# Patient Record
Sex: Male | Born: 1955 | Race: White | Hispanic: No | Marital: Married | Smoking: Former smoker
Health system: Southern US, Community
[De-identification: ages and names within clinical notes are randomized; demographics above are authoritative.]

## PROBLEM LIST (undated history)

## (undated) DIAGNOSIS — K760 Fatty (change of) liver, not elsewhere classified: Secondary | ICD-10-CM

## (undated) DIAGNOSIS — M9110 Juvenile osteochondrosis of head of femur [Legg-Calve-Perthes], unspecified leg: Secondary | ICD-10-CM

## (undated) DIAGNOSIS — I10 Essential (primary) hypertension: Secondary | ICD-10-CM

## (undated) DIAGNOSIS — E7800 Pure hypercholesterolemia, unspecified: Secondary | ICD-10-CM

## (undated) DIAGNOSIS — F411 Generalized anxiety disorder: Secondary | ICD-10-CM

## (undated) DIAGNOSIS — E119 Type 2 diabetes mellitus without complications: Secondary | ICD-10-CM

## (undated) DIAGNOSIS — K219 Gastro-esophageal reflux disease without esophagitis: Secondary | ICD-10-CM

## (undated) DIAGNOSIS — Z87898 Personal history of other specified conditions: Secondary | ICD-10-CM

## (undated) DIAGNOSIS — I7300 Raynaud's syndrome without gangrene: Secondary | ICD-10-CM

## (undated) DIAGNOSIS — G629 Polyneuropathy, unspecified: Secondary | ICD-10-CM

## (undated) HISTORY — PX: SPINAL CORD STIMULATOR IMPLANT: SHX2422

## (undated) HISTORY — DX: Fatty (change of) liver, not elsewhere classified: K76.0

## (undated) HISTORY — PX: CARDIAC CATHETERIZATION: SHX172

## (undated) HISTORY — DX: Polyneuropathy, unspecified: G62.9

## (undated) HISTORY — DX: Pure hypercholesterolemia, unspecified: E78.00

## (undated) HISTORY — DX: Personal history of other specified conditions: Z87.898

## (undated) HISTORY — DX: Juvenile osteochondrosis of head of femur (Legg-Calve-Perthes), unspecified leg: M91.10

## (undated) HISTORY — DX: Raynaud's syndrome without gangrene: I73.00

## (undated) HISTORY — DX: Essential (primary) hypertension: I10

## (undated) HISTORY — DX: Type 2 diabetes mellitus without complications: E11.9

## (undated) HISTORY — DX: Generalized anxiety disorder: F41.1

## (undated) HISTORY — PX: COLONOSCOPY: SHX174

## (undated) HISTORY — DX: Gastro-esophageal reflux disease without esophagitis: K21.9

---

## 2002-10-02 ENCOUNTER — Ambulatory Visit (HOSPITAL_COMMUNITY): Admission: RE | Admit: 2002-10-02 | Discharge: 2002-10-02 | Payer: Self-pay | Admitting: Gastroenterology

## 2004-09-15 ENCOUNTER — Ambulatory Visit (HOSPITAL_COMMUNITY): Admission: RE | Admit: 2004-09-15 | Discharge: 2004-09-15 | Payer: Self-pay | Admitting: *Deleted

## 2008-04-01 ENCOUNTER — Ambulatory Visit: Payer: Self-pay | Admitting: Family Medicine

## 2008-04-01 DIAGNOSIS — M7660 Achilles tendinitis, unspecified leg: Secondary | ICD-10-CM

## 2010-06-05 NOTE — Op Note (Signed)
   NAME:  Todd Gallegos, Todd Gallegos                            ACCOUNT NO.:  [id number]   MEDICAL RECORD NO.:  [id number]                   PATIENT TYPE:  AMB   LOCATION:  ENDO                                 FACILITY:  [facility]   PHYSICIAN:  Graylin Shiver, M.D.                DATE OF BIRTH:  27-Dec-1955   DATE OF PROCEDURE:  10/02/2002  DATE OF DISCHARGE:                                 OPERATIVE REPORT   PROCEDURE:  Colonoscopy.   INDICATIONS FOR PROCEDURE:  Family history of colon cancer in the patient's  father. Recent episode of rectal bleeding.  Informed consent was obtained after explanation of the risks of bleeding,  infection and perforation.   PREMEDICATIONS:  Fentanyl 80 micrograms IV, Versed 10 mg IV.   DESCRIPTION OF PROCEDURE:  With the patient in the left lateral decubitus  position a rectal examination  was performed and no masses were felt. The  Olympus colonoscope was inserted into the rectum and advanced  around the  colon to the cecum. Cecal landmarks were identified.   The cecum and ascending colon were normal. The transverse colon was normal.  The descending colon, sigmoid and rectum were normal. The scope was  retroflexed  in the rectum. No abnormalities were seen. The scope was  straightened and brought  out.  He tolerated the procedure well without complications.   IMPRESSION:  Normal colonoscopy to the cecum.                                               Graylin Shiver, M.D.    [name]  D:  10/02/2002  T:  10/02/2002  Job:  [id number]   cc:   Manuela Schwartz, M.D.

## 2010-06-05 NOTE — Cardiovascular Report (Signed)
NAMEURIAN, MARTENSON NO.:  [id number]   MEDICAL RECORD NO.:  [id number]          PATIENT TYPE:  OIB   LOCATION:  2899                         FACILITY:  [facility]   PHYSICIAN:  Meade Maw, M.D.    DATE OF BIRTH:  1956/01/03   DATE OF PROCEDURE:  DATE OF DISCHARGE:                              CARDIAC CATHETERIZATION   REFERRING PHYSICIAN:  Francis P. Modesto Charon, M.D.   INDICATIONS FOR PROCEDURE:  Abnormal Cardiolite, equivocal anterolateral  ischemia and the patient has felt that his exercise tolerance has decreased  with increasing dyspnea.   PROCEDURE:  After obtaining written informed consent, the patient was  brought to the cardiac catheterization lab in a post absorptive state.  Pre-  op sedation was achieved using Versed 2 mg IV.  Local anesthesia was  achieved using 1% Xylocaine.  A 6-French hemostasis sheath was placed into  the right femoral artery using the modified Seldinger technique.  Selective  coronary angiography was performed using a JL-4 and a to JR non-torque  right.  A single plane ventriculogram was performed in the REO position  using a 6-French pigtail curved catheter.  All catheter exchanges were made  over a guidewire.  The hemostasis sheath was flushed following each catheter  exchange.  There was no coronary artery disease identified.  The patient was  transferred to the holding area.  The hemostasis sheath was removed.  Hemostasis was achieved using digital pressure.  There was no immediate  complications.   FINDINGS:  1.  The aortic pressure is 110/67.  2.  His LV pressure was 97/5.  3.  The EDP is 10-12.   SINGLE-PLANE VENTRICULOGRAM:  Reveals normal wall motion, ejection fraction  of 60-65%.  There is post PVC MR noted only.   FLUOROSCOPY:  Reveals no significant calcification.   CORONARY ANGIOGRAPHY:  1.  The left main coronary artery bifurcates into the left anterior      descending and circumflex vessel.  There is no disease  noted in the left      main coronary artery.  2.  The left anterior descending.  The left anterior descending is a large      caliber vessel.  It gives rise to multiple diagonals.  It goes on to end      as an apical branch.  There is no disease noted in the left anterior      descending or its branches.  3.  Circumflex vessel.  The circumflex vessel is a moderate size caliber      vessel.  It gives rise to trivial OM-1, two large posterolateral      branches.  There is no disease noted in the circumflex or its branches.  4.  Right coronary artery.  The right coronary artery is a moderate sized      vessel.  It gives rise to two RV marginals, a small PDA, a small PL      branch.  There is no disease noted in the right coronary artery or its      branches.   IMPRESSION:  1.  Normal coronary angiography.  2.  Normal single-plane ventriculogram.  3.  Normal end-diastolic pressure.  4.  Other etiologies should be considered for dyspnea.      Meade Maw, M.D.  Electronically Signed     HP/MEDQ  D:  09/15/2004  T:  09/15/2004  Job:  [id number]   cc:   Thelma Barge P. Modesto Charon, M.D.  [address]  [place]  [place] [address]  Fax: [phone]

## 2018-04-21 ENCOUNTER — Ambulatory Visit: Payer: Self-pay | Admitting: Cardiology

## 2018-05-05 ENCOUNTER — Ambulatory Visit: Payer: Self-pay | Admitting: Cardiology

## 2018-05-18 ENCOUNTER — Other Ambulatory Visit: Payer: Self-pay | Admitting: Cardiology

## 2018-05-18 DIAGNOSIS — I340 Nonrheumatic mitral (valve) insufficiency: Secondary | ICD-10-CM

## 2018-12-25 ENCOUNTER — Other Ambulatory Visit: Payer: Self-pay

## 2019-02-22 ENCOUNTER — Encounter: Payer: Self-pay | Admitting: *Deleted

## 2019-02-23 ENCOUNTER — Encounter: Payer: Self-pay | Admitting: Neurology

## 2019-02-23 ENCOUNTER — Other Ambulatory Visit: Payer: Self-pay

## 2019-02-23 ENCOUNTER — Ambulatory Visit (INDEPENDENT_AMBULATORY_CARE_PROVIDER_SITE_OTHER): Admitting: Neurology

## 2019-02-23 VITALS — BP 121/65 | HR 72 | Temp 97.3°F | Ht 69.0 in | Wt 196.3 lb

## 2019-02-23 DIAGNOSIS — R202 Paresthesia of skin: Secondary | ICD-10-CM | POA: Diagnosis not present

## 2019-02-23 DIAGNOSIS — R269 Unspecified abnormalities of gait and mobility: Secondary | ICD-10-CM | POA: Diagnosis not present

## 2019-02-23 DIAGNOSIS — M7989 Other specified soft tissue disorders: Secondary | ICD-10-CM | POA: Diagnosis not present

## 2019-02-23 MED ORDER — GABAPENTIN 100 MG PO CAPS: 300.0000 mg | ORAL_CAPSULE | Freq: Every day | ORAL | 11 refills | Status: DC

## 2019-02-23 NOTE — Addendum Note (Signed)
Addended by: Levert Feinstein on: 02/23/2019 10:07 AM   Modules accepted: Orders

## 2019-02-23 NOTE — Progress Notes (Signed)
PATIENT: Todd Gallegos DOB: 07-Apr-1955  Chief Complaint  Patient presents with  . New Patient (Initial Visit)    New room, alone. Paper referral for peripheral neuropathy. Has worsenend in the last three years. Numbness/ting from knees down. Cannot sit in recliner long watching TV.  Marland Kitchen PCP    Beverley Fiedler, MD     HISTORICAL  Todd Gallegos is a 64 year old male, seen in request by his primary care physician Dr. Barbaraann Barthel, [place], for evaluation of bilateral feet paresthesia, initial evaluation was on February 23, 2019.  I have reviewed and summarized the referring note from the referring physician.  He had a past medical history of depression, currently on stable dose of Lexapro 20 mg daily, Wellbutrin XL 300 mg daily, also had a history of hypertension, prediabetes, most recent A1c was 6.2, he reported 35 years history of heavy alcohol use, created in 2015  Around 2015, he noticed mild bilateral feet and toes paresthesia, mainly involving plantar surface, numbness tingling, gradually getting worse over the past 5 years, now at evening time, when he tried to relax, sitting down watching TV, he would have burning pain of his bilateral feet, difficult to sit still, sometimes toes purplish reddish discoloration,  He denies significant gait abnormality, able to run 2 to 3 miles without difficulty, but with running, he noticed burning sensation extending to lateral shin level, over the past 10 years, he also noticed bilateral shin lateral mass, soft, nontender to touch, stable in size, but was running, it becomes sensitive, burning pain.  He denies bilateral upper extremity involvement, he denies neck pain,  Laboratory evaluations in December 2020 showed elevated A1c 6.2, normal B12, folic acid, TSH, CMP showed mild abnormal liver functional test, normal CBC hemoglobin of 14   REVIEW OF SYSTEMS: Full 14 system review of systems performed and notable only for as above All other review of  systems were negative.  ALLERGIES: No Known Allergies  HOME MEDICATIONS: Current Outpatient Medications  Medication Sig Dispense Refill  . atorvastatin (LIPITOR) 20 MG tablet Take 20 mg by mouth daily.    . benazepril-hydrochlorthiazide (LOTENSIN HCT) 20-25 MG tablet Take 1 tablet by mouth daily.    Marland Kitchen buPROPion (WELLBUTRIN XL) 300 MG 24 hr tablet Take 300 mg by mouth daily.    . Coenzyme Q10 (CO Q 10 PO) Take 60 mg by mouth daily.    Marland Kitchen escitalopram (LEXAPRO) 20 MG tablet Take 20 mg by mouth daily.    . Glucosamine-Chondroit-Vit C-Mn (GLUCOSAMINE 1500 COMPLEX PO) Take 1 tablet by mouth daily.    Marland Kitchen omeprazole (PRILOSEC) 20 MG capsule Take 20 mg by mouth daily.    Marland Kitchen testosterone cypionate (DEPOTESTOSTERONE CYPIONATE) 200 MG/ML injection Inject 200 mg into the muscle every 7 (seven) days.     No current facility-administered medications for this visit.    PAST MEDICAL HISTORY: Past Medical History:  Diagnosis Date  . Acid reflux   . Diabetes (HCC)    Per PCP records, never had abnl HgA1c - highest fasting glucose 115 - seems more metabolic syndrome  . GAD (generalized anxiety disorder)   . History of alcohol use    heavy  . Hypercholesteremia   . Hypertension   . Neuropathy   . Perthes disease    childhood    PAST SURGICAL HISTORY: Past Surgical History:  Procedure Laterality Date  . CARDIAC CATHETERIZATION    . COLONOSCOPY     x 2    FAMILY HISTORY: Family History  Problem Relation Age of Onset  . Asthma Mother   . Hypercholesterolemia Mother   . Hypertension Mother   . Colon cancer Father   . Hypercholesterolemia Father   . Hypertension Father   . CAD Father     SOCIAL HISTORY: Social History   Socioeconomic History  . Marital status: Married    Spouse name: Not on file  . Number of children: 2  . Years of education: some college  . Highest education level: Not on file  Occupational History  . Occupation: Emergency planning/management officer  Tobacco Use  . Smoking  status: Former Games developer  . Smokeless tobacco: Former Engineer, water and Sexual Activity  . Alcohol use: Not Currently    Comment: hx of heavy use prior to 2016  . Drug use: Never  . Sexual activity: Not on file  Other Topics Concern  . Not on file  Social History Narrative   Caffeine: 1 pot coffee per day   Lives with wife   Right handed   Social Determinants of Health   Financial Resource Strain:   . Difficulty of Paying Living Expenses: Not on file  Food Insecurity:   . Worried About Programme researcher, broadcasting/film/video in the Last Year: Not on file  . Ran Out of Food in the Last Year: Not on file  Transportation Needs:   . Lack of Transportation (Medical): Not on file  . Lack of Transportation (Non-Medical): Not on file  Physical Activity:   . Days of Exercise per Week: Not on file  . Minutes of Exercise per Session: Not on file  Stress:   . Feeling of Stress : Not on file  Social Connections:   . Frequency of Communication with Friends and Family: Not on file  . Frequency of Social Gatherings with Friends and Family: Not on file  . Attends Religious Services: Not on file  . Active Member of Clubs or Organizations: Not on file  . Attends Banker Meetings: Not on file  . Marital Status: Not on file  Intimate Partner Violence:   . Fear of Current or Ex-Partner: Not on file  . Emotionally Abused: Not on file  . Physically Abused: Not on file  . Sexually Abused: Not on file     PHYSICAL EXAM   Vitals:   02/23/19 0838  BP: 121/65  Pulse: 72  Temp: (!) 97.3 F (36.3 C)  SpO2: 98%  Weight: 196 lb 4.8 oz (89 kg)  Height: 5\' 9"  (1.753 m)    Not recorded      Body mass index is 28.99 kg/m.  PHYSICAL EXAMNIATION:  Gen: NAD, conversant, well nourised, well groomed                     Cardiovascular: Regular rate rhythm, no peripheral edema, warm, nontender. Eyes: Conjunctivae clear without exudates or hemorrhage Neck: Supple, no carotid bruits. Pulmonary: Clear  to auscultation bilaterally   NEUROLOGICAL EXAM:  MENTAL STATUS: Speech:    Speech is normal; fluent and spontaneous with normal comprehension.  Cognition:     Orientation to time, place and person     Normal recent and remote memory     Normal Attention span and concentration     Normal Language, naming, repeating,spontaneous speech     Fund of knowledge   CRANIAL NERVES: CN II: Visual fields are full to confrontation. Pupils are round equal and briskly reactive to light. CN III, IV, VI: extraocular movement are normal. No  ptosis. CN V: Facial sensation is intact to light touch CN VII: Face is symmetric with normal eye closure  CN VIII: Hearing is normal to causal conversation. CN IX, X: Phonation is normal. CN XI: Head turning and shoulder shrug are intact  MOTOR: There is no pronator drift of out-stretched arms. Muscle bulk and tone are normal. Muscle strength is normal.  REFLEXES: Reflexes are 3 and symmetric at the biceps, triceps, knees, and absent ankles. Plantar responses are flexor.  SENSORY: Length dependent decreased to light touch, pinprick and vibratory sensation to ankle level  COORDINATION: Mild truncal ataxia, mild finger-to-nose, heel-to-shin dysmetria,  GAIT/STANCE: Mild truncal ataxia, wide-based, mildly unsteady gait, able to walk on tiptoes and heels, difficulty with tandem walking Romberg is absent.   DIAGNOSTIC DATA (LABS, IMAGING, TESTING) - I reviewed patient records, labs, notes, testing and imaging myself where available.   ASSESSMENT AND PLAN  Todd Gallegos is a 64 y.o. male   Bilateral lower extremity burning pain, Mild wide-based unsteady gait, mild difficulty perform tandem walking, History of long-term alcohol use  He has length dependent sensory changes, absent ankle reflex, above findings support a diagnosis of peripheral neuropathy,  Risk factor including previous long-term alcohol use, mild elevated A1c 6.2,  Complete laboratory  evaluation to rule out other treatable etiology  Gabapentin 100 mg up to 3 tablets every night for symptomatic control,  EMG nerve conduction study  Bilateral lateral shin soft tissue mass  Ultrasound to better characterize the lesion   Marcial Pacas, M.D. Ph.D.  [facility] [address], [place], [place] [address] Ph: [phone] Fax: [phone]  CC: Rankins, Bill Salinas, MD

## 2019-02-26 ENCOUNTER — Telehealth: Payer: Self-pay | Admitting: Neurology

## 2019-02-26 NOTE — Telephone Encounter (Signed)
I tried to [facility] patient he did not answer. I left message stating he could call [facility] at [phone] opt.1 then 5 . Thanks Annabelle Harman

## 2019-02-28 ENCOUNTER — Telehealth: Payer: Self-pay | Admitting: *Deleted

## 2019-02-28 ENCOUNTER — Ambulatory Visit
Admission: RE | Admit: 2019-02-28 | Discharge: 2019-02-28 | Disposition: A | Discharge status: Home / Self Care | Source: Ambulatory Visit | Attending: Neurology | Admitting: Neurology

## 2019-02-28 DIAGNOSIS — R269 Unspecified abnormalities of gait and mobility: Secondary | ICD-10-CM

## 2019-02-28 DIAGNOSIS — R202 Paresthesia of skin: Secondary | ICD-10-CM

## 2019-02-28 DIAGNOSIS — M7989 Other specified soft tissue disorders: Secondary | ICD-10-CM

## 2019-02-28 LAB — PROTEIN ELECTROPHORESIS
A/G Ratio: 1.3 (ref 0.7–1.7)
Albumin ELP: 4 g/dL (ref 2.9–4.4)
Alpha 1: 0.3 g/dL (ref 0.0–0.4)
Alpha 2: 0.7 g/dL (ref 0.4–1.0)
Beta: 1.5 g/dL — ABNORMAL HIGH (ref 0.7–1.3)
Gamma Globulin: 0.8 g/dL (ref 0.4–1.8)
Globulin, Total: 3.2 g/dL (ref 2.2–3.9)
Total Protein: 7.2 g/dL (ref 6.0–8.5)

## 2019-02-28 LAB — RPR: RPR Ser Ql: NONREACTIVE

## 2019-02-28 LAB — ANA W/REFLEX IF POSITIVE: Anti Nuclear Antibody (ANA): NEGATIVE

## 2019-02-28 LAB — SEDIMENTATION RATE: Sed Rate: 7 mm/hr (ref 0–30)

## 2019-02-28 LAB — C-REACTIVE PROTEIN: CRP: 1 mg/L (ref 0–10)

## 2019-02-28 NOTE — Telephone Encounter (Signed)
-----   Message from Levert Feinstein, MD sent at 02/28/2019  9:10 AM EST ----- Please call patient for no significant abnormality on laboratory evaluations

## 2019-02-28 NOTE — Telephone Encounter (Signed)
Todd Feinstein, MD  Lilla Shook, RN  Please call patient for no significant abnormality on laboratory evaluations.  I was able to speak to the patient and provide him with his lab results.

## 2019-03-01 ENCOUNTER — Telehealth: Payer: Self-pay | Admitting: Neurology

## 2019-03-01 NOTE — Telephone Encounter (Signed)
Please call patient, ultrasound of lower extremity were normal  IMPRESSION: Negative ultrasound of sites of clinical concern at the lower legs bilaterally.

## 2019-03-01 NOTE — Telephone Encounter (Signed)
I called patient and LVM to schedule NCV/EMG. 

## 2019-03-01 NOTE — Telephone Encounter (Signed)
I spoke to the patient and provided him with his ultrasound results. He verbalized understanding.

## 2019-04-04 ENCOUNTER — Other Ambulatory Visit: Payer: Self-pay | Admitting: *Deleted

## 2019-04-04 ENCOUNTER — Ambulatory Visit (INDEPENDENT_AMBULATORY_CARE_PROVIDER_SITE_OTHER): Admitting: Neurology

## 2019-04-04 ENCOUNTER — Other Ambulatory Visit: Payer: Self-pay

## 2019-04-04 ENCOUNTER — Telehealth: Payer: Self-pay | Admitting: Neurology

## 2019-04-04 ENCOUNTER — Ambulatory Visit
Admission: RE | Admit: 2019-04-04 | Discharge: 2019-04-04 | Disposition: A | Discharge status: Home / Self Care | Source: Ambulatory Visit | Attending: Neurology | Admitting: Neurology

## 2019-04-04 DIAGNOSIS — M25551 Pain in right hip: Secondary | ICD-10-CM

## 2019-04-04 DIAGNOSIS — R269 Unspecified abnormalities of gait and mobility: Secondary | ICD-10-CM

## 2019-04-04 DIAGNOSIS — R202 Paresthesia of skin: Secondary | ICD-10-CM

## 2019-04-04 DIAGNOSIS — M7989 Other specified soft tissue disorders: Secondary | ICD-10-CM

## 2019-04-04 DIAGNOSIS — M5441 Lumbago with sciatica, right side: Secondary | ICD-10-CM

## 2019-04-04 DIAGNOSIS — G8929 Other chronic pain: Secondary | ICD-10-CM | POA: Insufficient documentation

## 2019-04-04 DIAGNOSIS — R9389 Abnormal findings on diagnostic imaging of other specified body structures: Secondary | ICD-10-CM

## 2019-04-04 MED ORDER — GABAPENTIN 300 MG PO CAPS: 300.0000 mg | ORAL_CAPSULE | Freq: Three times a day (TID) | ORAL | 11 refills | Status: DC

## 2019-04-04 MED ORDER — METANX 3-90.314-2-35 MG PO CAPS: 1.0000 | ORAL_CAPSULE | Freq: Two times a day (BID) | ORAL | 11 refills | Status: DC

## 2019-04-04 NOTE — Addendum Note (Signed)
Addended by: Levert Feinstein on: 04/04/2019 11:27 AM   Modules accepted: Orders

## 2019-04-04 NOTE — Patient Instructions (Signed)
[facility]    Address: [address], [place], [place] [address]  Phone: [phone]   

## 2019-04-04 NOTE — Procedures (Signed)
Full Name: Todd Gallegos Gender: Male MRN #: [id number] Date of Birth: [birth year]    Visit Date: 04/04/2019 08:45 Age: 64 Years Examining Physician: Levert Feinstein, MD  Referring Physician: Levert Feinstein, MD History: 65 year old male presented with chronic low back pain, radiating pain to right hip, bilateral feet paresthesia and discoloration  Summary of the tests:  Nerve conduction study:  Bilateral superficial peroneal sensory responses were absent.  Bilateral sural sensory responses was at low normal range.  Bilateral tibial and left peroneal to EDB motor responses were normal.  Right peroneal to EDB motor response showed moderately prolonged distal latency, with moderately decreased CMAP amplitude.  Electromyography:  Selected needle examination of bilateral lower extremity muscles and bilateral lumbosacral paraspinal muscles were normal.  Conclusion: This is an abnormal study.  There is electrodiagnostic evidence of mild length dependent sensorimotor polyneuropathy, there is no evidence of bilateral lumbosacral radiculopathy.    ------------------------------- Levert Feinstein M.D. PhD  [facility] [address] [place], [place] [address] Tel: [phone] Fax: [phone]         [facility]    Nerve / Sites Muscle Latency Ref. Amplitude Ref. Rel Amp Segments Distance Velocity Ref. Area    ms ms mV mV %  cm m/s m/s mVms  R Peroneal - EDB     Ankle EDB 8.5 ?6.5 1.0 ?2.0 100 Ankle - EDB 9   4.3     Fib head EDB 15.1  1.1  111 Fib head - Ankle 29 44 ?44 3.3     Pop fossa EDB 17.4  0.9  85.1 Pop fossa - Fib head 10 44 ?44 3.2         Pop fossa - Ankle      L Peroneal - EDB     Ankle EDB 5.2 ?6.5 6.0 ?2.0 100 Ankle - EDB 9   21.1     Fib head EDB 11.8  6.4  108 Fib head - Ankle 29 44 ?44 22.4     Pop fossa EDB 14.0  5.7  89.5 Pop fossa - Fib head 10 44 ?44 22.0         Pop fossa - Ankle      R Tibial - AH     Ankle AH 4.3 ?5.8 12.5 ?4.0 100 Ankle - AH 9    35.2     Pop fossa AH 13.6  8.6  68.7 Pop fossa - Ankle 38 41 ?41 31.6  L Tibial - AH     Ankle AH 4.6 ?5.8 10.1 ?4.0 100 Ankle - AH 9   29.2     Pop fossa AH 13.9  8.1  79.7 Pop fossa - Ankle 38 41 ?41 26.9             SNC    Nerve / Sites Rec. Site Peak Lat Ref.  Amp Ref. Segments Distance    ms ms V V  cm  R Sural - Ankle (Calf)     Calf Ankle 4.1 ?4.4 6 ?6 Calf - Ankle 14  L Sural - Ankle (Calf)     Calf Ankle 4.3 ?4.4 5 ?6 Calf - Ankle 14  R Superficial peroneal - Ankle     Lat leg Ankle NR ?4.4 NR ?6 Lat leg - Ankle 14  L Superficial peroneal - Ankle     Lat leg Ankle NR ?4.4 NR ?6 Lat leg - Ankle 14  F  Wave    Nerve F Lat Ref.   ms ms  R Tibial - AH 55.9 ?56.0  L Tibial - AH 55.6 ?56.0         EMG Summary Table    Spontaneous MUAP Recruitment  Muscle IA Fib PSW Fasc Other Amp Dur. Poly Pattern  L. Tibialis anterior Normal None None None _______ Normal Normal Normal Normal  L. Tibialis posterior Normal None None None _______ Normal Normal Normal Normal  L. Gastrocnemius (Medial head) Normal None None None _______ Normal Normal Normal Normal  L. Peroneus longus Normal None None None _______ Normal Normal Normal Normal  R. Tibialis anterior Normal None None None _______ Normal Normal Normal Normal  R. Tibialis posterior Normal None None None _______ Normal Normal Normal Normal  R. Peroneus longus Normal None None None _______ Normal Normal Normal Normal  R. Gastrocnemius (Medial head) Normal None None None _______ Normal Normal Normal Normal  R. Vastus lateralis Normal None None None _______ Normal Normal Normal Normal  L. Vastus lateralis Normal None None None _______ Normal Normal Normal Normal  R. Lumbar paraspinals (low) Normal None None None _______ Normal Normal Normal Normal  R. Lumbar paraspinals (mid) Normal None None None _______ Normal Normal Normal Normal  L. Lumbar paraspinals (low) Normal None None None _______ Normal Normal Normal Normal  L.  Lumbar paraspinals (mid) Normal None None None _______ Normal Normal Normal Normal  L. Abductor hallucis Normal None None None _______ Normal Normal Normal Normal

## 2019-04-04 NOTE — Telephone Encounter (Signed)
Left messages requesting a return call.

## 2019-04-04 NOTE — Telephone Encounter (Signed)
Please call patient, xray of right hip showed   IMPRESSION: No acute bony abnormality.  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  protrusio acetabuli: medialization of the medial wall of the acetabulum past the ilioischial line, (intrapelvic displacement of femoral head and acetabulum).  May refer him to orthopedics for further evaluations

## 2019-04-04 NOTE — Telephone Encounter (Signed)
[facility] order sent to GI. They will reach out to the patient to schedule.  

## 2019-04-04 NOTE — Progress Notes (Addendum)
PATIENT: Todd Gallegos DOB: [birth year]  HISTORICAL  Todd Gallegos is a 63 year old male, seen in request by his primary care physician Dr. Radene Ou, [place], for evaluation of bilateral feet paresthesia, initial evaluation was on February 23, 2019.  I have reviewed and summarized the referring note from the referring physician.  He had a past medical history of depression, currently on stable dose of Lexapro 20 mg daily, Wellbutrin XL 300 mg daily, also had a history of hypertension, prediabetes, most recent A1c was 6.2, he reported 35 years history of heavy alcohol use, quitted in 2015  Around 2015, he noticed mild bilateral feet and toes paresthesia, mainly involving plantar surface, numbness tingling, gradually getting worse over the past 5 years, now at evening time, when he tried to relax, sitting down watching TV, he would have burning pain of his bilateral feet, difficult to sit still, sometimes toes purplish reddish discoloration,  He denies significant gait abnormality, able to run 2 to 3 miles without difficulty, but with running, he noticed burning sensation extending to lateral shin level, over the past 10 years, he also noticed bilateral shin lateral mass, soft, nontender to touch, stable in size, but was running, it becomes sensitive, burning pain.  He denies bilateral upper extremity involvement, he denies neck pain,  Laboratory evaluations in December 2020 showed elevated A1c 6.2, normal H47, folic acid, TSH, CMP showed mild abnormal liver functional test, normal CBC hemoglobin of 14  UPDATE April 04 2019: Patient return for electrodiagnostic study today, which showed evidence of mild axonal sensorimotor polyneuropathy, there is no evidence of bilateral lumbosacral radiculopathy.  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  ALLERGIES: No Known Allergies  HOME MEDICATIONS: Current Outpatient Medications  Medication Sig Dispense Refill  . atorvastatin (LIPITOR) 20 MG tablet Take 20 mg by mouth daily.    . benazepril-hydrochlorthiazide (LOTENSIN HCT) 20-25 MG tablet Take 1 tablet by mouth daily.    Marland Kitchen buPROPion (WELLBUTRIN XL) 300 MG 24 hr tablet Take 300 mg by mouth daily.    . Coenzyme Q10 (CO Q 10 PO) Take 60 mg by mouth daily.    Marland Kitchen escitalopram (LEXAPRO) 20 MG tablet Take 20 mg by mouth daily.    Marland Kitchen gabapentin (NEURONTIN) 100 MG capsule Take 3 capsules (300 mg total) by mouth at bedtime. 90 capsule 11  . Glucosamine-Chondroit-Vit C-Mn (GLUCOSAMINE 1500 COMPLEX PO) Take 1 tablet by mouth daily.    Marland Kitchen omeprazole (PRILOSEC) 20 MG capsule Take 20 mg by mouth daily.    Marland Kitchen testosterone cypionate (DEPOTESTOSTERONE CYPIONATE) 200 MG/ML injection Inject 200 mg into the muscle every 7 (seven) days.     No current facility-administered medications for this visit.    PAST MEDICAL HISTORY: Past Medical History:  Diagnosis Date  . Acid reflux   . Diabetes ([place])  Per PCP records, never had abnl HgA1c - highest fasting glucose 115 - seems more metabolic syndrome  . GAD (generalized anxiety disorder)   . History of alcohol use    heavy  . Hypercholesteremia   . Hypertension   . Neuropathy   . Perthes disease    childhood     PAST SURGICAL HISTORY: Past Surgical History:  Procedure Laterality Date  . CARDIAC CATHETERIZATION    . COLONOSCOPY     x 2    FAMILY HISTORY: Family History  Problem Relation Age of Onset  . Asthma Mother   . Hypercholesterolemia Mother   . Hypertension Mother   . Colon cancer Father   . Hypercholesterolemia Father   . Hypertension Father   . CAD Father     SOCIAL HISTORY: Social History   Socioeconomic History  . Marital status: Married    Spouse name: Not on file  . Number of children: 2  . Years of education: some college  . Highest education level: Not on file  Occupational History  . Occupation: Government social research officer  Tobacco Use  . Smoking status: Former Research scientist (life sciences)  . Smokeless tobacco: Former Network engineer and Sexual Activity  . Alcohol use: Not Currently    Comment: hx of heavy use prior to 2016  . Drug use: Never  . Sexual activity: Not on file  Other Topics Concern  . Not on file  Social History Narrative   Caffeine: 1 pot coffee per day   Lives with wife   Right handed   Social Determinants of Health   Financial Resource Strain:   . Difficulty of Paying Living Expenses:   Food Insecurity:   . Worried About Charity fundraiser in the Last Year:   . Arboriculturist in the Last Year:   Transportation Needs:   . Film/video editor (Medical):   Marland Kitchen Lack of Transportation (Non-Medical):   Physical Activity:   . Days of Exercise per Week:   . Minutes of Exercise per Session:   Stress:   . Feeling of Stress :   Social Connections:   . Frequency of Communication with Friends and Family:   . Frequency of Social Gatherings with Friends and Family:   . Attends Religious Services:   . Active Member of Clubs or Organizations:   . Attends Archivist Meetings:   Marland Kitchen Marital Status:   Intimate Partner Violence:   . Fear of Current or Ex-Partner:   . Emotionally Abused:   Marland Kitchen Physically Abused:   . Sexually Abused:      PHYSICAL EXAM   There  were no vitals filed for this visit.  Not recorded      There is no height or weight on file to calculate BMI.  PHYSICAL EXAMNIATION:  NEUROLOGICAL EXAM:  MENTAL STATUS: Speech:    Speech is normal; fluent and spontaneous with normal comprehension.  Cognition:     Orientation to time, place and person     Normal recent and remote memory     Normal Attention span and concentration     Normal Language, naming, repeating,spontaneous speech     Fund of knowledge   CRANIAL NERVES: CN II: Visual fields are full to confrontation. Pupils are round equal and briskly reactive to light. CN III, IV, VI: extraocular movement are normal. No ptosis. CN V: Facial sensation is intact to light touch CN VII: Face is symmetric with normal eye closure  CN VIII: Hearing is normal  to causal conversation. CN IX, X: Phonation is normal. CN XI: Head turning and shoulder shrug are intact  MOTOR: There is no pronator drift of out-stretched arms. Muscle bulk and tone are normal. Muscle strength is normal.  REFLEXES: Reflexes are 3 and symmetric at the biceps, triceps, knees, and trace at ankles. Plantar responses are flexor.  SENSORY: Length dependent decreased to light touch, pinprick and vibratory sensation to ankle level  COORDINATION: No limb dysmetria noted  GAIT/STANCE: Steady  DIAGNOSTIC DATA (LABS, IMAGING, TESTING) - I reviewed patient records, labs, notes, testing and imaging myself where available.   ASSESSMENT AND PLAN  Todd Gallegos is a 64 y.o. male   Peripheral neuropathy  EMG nerve conduction study today confirmed mild length dependent axonal sensorimotor polyneuropathy,  Potential etiology including his previous 35 years of heavy alcohol use, A1c was 6.2,  Increase gabapentin to 300 mg up to 3 times a day  Add on Mentax bid  Chronic low back pain, radiating pain to right hip, history of right Perthes disease  MRI of lumbar to rule out lumbar radiculopathy  X-ray of  right hip   Marcial Pacas, M.D. Ph.D.  [facility] [address] [place], [place] [address] Ph: [phone] Fax: [phone]  CC: Aretta Nip, MD  Addendum, I reviewed [facility] neurosurgery evaluation from Dr. 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 Katherine Roan advised aggressive medical management including medication treatment, physical therapy, escalation to epidural steroid injection, if the radicular/neurogenic claudication system persisted despite aggressive medical management, then surgical intervention such as decompressive laminectomy with medial facetectomy at L3-4 levels could be considered

## 2019-04-05 NOTE — Telephone Encounter (Signed)
I spoke to the patient and he is aware of his results. He would like a referral placed for orthopaedics. It has been placed in Epic.

## 2019-04-05 NOTE — Addendum Note (Signed)
Addended by: Lindell Spar C on: 04/05/2019 09:10 AM   Modules accepted: Orders

## 2019-04-21 ENCOUNTER — Ambulatory Visit: Attending: Internal Medicine

## 2019-04-21 DIAGNOSIS — Z23 Encounter for immunization: Secondary | ICD-10-CM

## 2019-04-21 NOTE — Progress Notes (Signed)
   Covid-19 Vaccination Clinic  Name:  Aadit Hagood    MRN: [id number] DOB: 07/22/1955  04/21/2019  Mr. Nitschke was observed post Covid-19 immunization for 15 minutes without incident. He was provided with Vaccine Information Sheet and instruction to access the V-Safe system.   Mr. Grumbine was instructed to call 911 with any severe reactions post vaccine: Marland Kitchen Difficulty breathing  . Swelling of face and throat  . A fast heartbeat  . A bad rash all over body  . Dizziness and weakness

## 2019-04-26 ENCOUNTER — Other Ambulatory Visit

## 2019-04-27 ENCOUNTER — Ambulatory Visit
Admission: RE | Admit: 2019-04-27 | Discharge: 2019-04-27 | Disposition: A | Discharge status: Home / Self Care | Source: Ambulatory Visit | Attending: Neurology | Admitting: Neurology

## 2019-04-27 DIAGNOSIS — G8929 Other chronic pain: Secondary | ICD-10-CM

## 2019-04-27 DIAGNOSIS — M25551 Pain in right hip: Secondary | ICD-10-CM

## 2019-04-27 DIAGNOSIS — R202 Paresthesia of skin: Secondary | ICD-10-CM

## 2019-04-27 DIAGNOSIS — R269 Unspecified abnormalities of gait and mobility: Secondary | ICD-10-CM

## 2019-04-27 DIAGNOSIS — M7989 Other specified soft tissue disorders: Secondary | ICD-10-CM

## 2019-05-02 ENCOUNTER — Telehealth: Payer: Self-pay | Admitting: Neurology

## 2019-05-02 DIAGNOSIS — M5416 Radiculopathy, lumbar region: Secondary | ICD-10-CM

## 2019-05-02 NOTE — Telephone Encounter (Signed)
I have called the patient and he verbalized understanding of his MRI results. He would like to proceed with the referral to neurosurgery and will further review the MRI with them.

## 2019-05-02 NOTE — Addendum Note (Signed)
Addended by: Levert Feinstein on: 05/02/2019 11:30 AM   Modules accepted: Orders

## 2019-05-02 NOTE — Telephone Encounter (Signed)
Pt would like a call once the results of the MRI are available

## 2019-05-02 NOTE — Telephone Encounter (Signed)
Called and spoke to patient relayed we did not have to send CD's doctor's have access to [facility]. Would call him to schedule. Patient understood . Thanks Annabelle Harman

## 2019-05-02 NOTE — Telephone Encounter (Signed)
Please call patient, MRI of lumbar showed L3-4 severe canal stenosis and bilateral foraminal narrowing,  I have referred him to neurosurgeon, if he needs, may give him an earlier appt with me to review MRI findings  IMPRESSION: Abnormal MRI scan of the lumbar spine without contrast showing mild spondylitic changes throughout most prominent at L3-4 where there is subligamentous disc herniation as well as prominent facet hypertrophy resulting in severe canal as well as bilateral foraminal narrowing with likely encroachment on the exiting nerve roots.  There is mild bilateral foraminal narrowing at L2-3, and were necessary to as well as L4-5 as well.

## 2019-05-14 NOTE — Telephone Encounter (Signed)
Pt requesting a call stating he would like to move the location of where his Neuro surg referral had been sent to(wanting sent to Dr. Trinda Pascal? on [address]). Please call to advise

## 2019-05-16 ENCOUNTER — Ambulatory Visit: Attending: Internal Medicine

## 2019-05-16 DIAGNOSIS — Z23 Encounter for immunization: Secondary | ICD-10-CM

## 2019-05-16 NOTE — Progress Notes (Signed)
   Covid-19 Vaccination Clinic  Name:  Todd Gallegos    MRN: [id number] DOB: 1955-06-21  05/16/2019  Mr. Beck was observed post Covid-19 immunization for 15 minutes without incident. He was provided with Vaccine Information Sheet and instruction to access the V-Safe system.   Mr. Pe was instructed to call 911 with any severe reactions post vaccine: Marland Kitchen Difficulty breathing  . Swelling of face and throat  . A fast heartbeat  . A bad rash all over body  . Dizziness and weakness   Immunizations Administered    Name Date Dose VIS Date Route   Pfizer COVID-19 Vaccine 05/16/2019  8:26 AM 0.3 mL 03/14/2018 Intramuscular   Manufacturer: [employer], [employer]   Lot: [id number]   NDC: [address]-0

## 2019-05-16 NOTE — Telephone Encounter (Signed)
I have talked to patient referral has been sent to Dr. Petra Kuba Telephone [phone] - [phone] . Patient is aware of details.

## 2019-05-31 ENCOUNTER — Other Ambulatory Visit: Payer: Self-pay

## 2019-05-31 MED ORDER — GABAPENTIN 300 MG PO CAPS: 300.0000 mg | ORAL_CAPSULE | Freq: Three times a day (TID) | ORAL | 11 refills | Status: DC

## 2019-07-12 ENCOUNTER — Other Ambulatory Visit: Payer: Self-pay | Admitting: *Deleted

## 2019-07-12 NOTE — Telephone Encounter (Addendum)
  See phone note for his questions,  Per previous office visit in March 2021: Peripheral neuropathy             EMG nerve conduction study today confirmed mild length dependent axonal sensorimotor polyneuropathy,             Potential etiology including his previous 35 years of heavy alcohol use, A1c was 6.2,             Increase gabapentin to 300 mg up to 3 times a day             Add on Mentax bid  Chronic low back pain, radiating pain to right hip, history of right Perthes disease             MRI of lumbar to rule out lumbar radiculopathy             X-ray of right hip IMPRESSION: Abnormal MRI scan of the lumbar spine without contrast showing mild spondylitic changes throughout most prominent at L3-4 where there is subligamentous disc herniation as well as prominent facet hypertrophy resulting in severe canal as well as bilateral foraminal narrowing with likely encroachment on the exiting nerve roots.  There is mild bilateral foraminal narrowing at L2-3, and were necessary to as well as L4-5 as well.  MRI of lumbar spine April 2021: Did show evidence of multiple level degenerative changes, most prominent at L3-4, there is severe canal, as well as bilateral foraminal narrowing   If gabapentin 300 mg 3 times daily is not helpful at all, may consider switch to Lyrica 100 mg 3 times daily, if gabapentin helped him some, we have more room to push up higher dose of gabapentin up to 300 mg 3 tablets 3 times a day  May also consider diclofenac gel, plus EMLA gel for symptomatic control

## 2019-07-12 NOTE — Telephone Encounter (Signed)
I spoke to the patient. He is only taking gabapentin 600mg  after dinner each evening and it helps some. He would like to try the higher dosage of gabapentin 300mg , three capsules TID before moving to another medication. He would also like to try the diclofenac gel. He understandings that Lyrica and EMLA are still options.

## 2019-08-01 ENCOUNTER — Other Ambulatory Visit: Payer: Self-pay | Admitting: *Deleted

## 2019-08-01 MED ORDER — DICLOFENAC SODIUM 1 % EX GEL: 2.0000 g | Freq: Four times a day (QID) | CUTANEOUS | 4 refills | Status: DC | PRN

## 2019-08-01 MED ORDER — GABAPENTIN 300 MG PO CAPS: 900.0000 mg | ORAL_CAPSULE | Freq: Three times a day (TID) | ORAL | 3 refills | Status: DC

## 2020-08-01 ENCOUNTER — Other Ambulatory Visit: Payer: Self-pay | Admitting: Neurology

## 2020-11-07 ENCOUNTER — Other Ambulatory Visit: Payer: Self-pay | Admitting: Gastroenterology

## 2020-12-22 ENCOUNTER — Encounter: Payer: Self-pay | Admitting: Neurology

## 2020-12-22 ENCOUNTER — Ambulatory Visit (INDEPENDENT_AMBULATORY_CARE_PROVIDER_SITE_OTHER): Admitting: Neurology

## 2020-12-22 ENCOUNTER — Telehealth: Payer: Self-pay | Admitting: Neurology

## 2020-12-22 VITALS — BP 152/84 | HR 76 | Ht 69.0 in | Wt 203.5 lb

## 2020-12-22 DIAGNOSIS — M48062 Spinal stenosis, lumbar region with neurogenic claudication: Secondary | ICD-10-CM | POA: Diagnosis not present

## 2020-12-22 DIAGNOSIS — R269 Unspecified abnormalities of gait and mobility: Secondary | ICD-10-CM | POA: Diagnosis not present

## 2020-12-22 DIAGNOSIS — G629 Polyneuropathy, unspecified: Secondary | ICD-10-CM | POA: Diagnosis not present

## 2020-12-22 MED ORDER — LIDOCAINE-PRILOCAINE 2.5-2.5 % EX CREA: 1.0000 "application " | TOPICAL_CREAM | CUTANEOUS | 3 refills | Status: AC | PRN

## 2020-12-22 MED ORDER — DICLOFENAC SODIUM 1 % EX GEL: 2.0000 g | Freq: Four times a day (QID) | CUTANEOUS | 4 refills | Status: DC | PRN

## 2020-12-22 MED ORDER — GABAPENTIN 300 MG PO CAPS: 900.0000 mg | ORAL_CAPSULE | Freq: Three times a day (TID) | ORAL | 3 refills | Status: DC

## 2020-12-22 NOTE — Progress Notes (Signed)
ASSESSMENT AND PLAN  Todd Gallegos is a 65 y.o. male   Peripheral neuropathy  EMG nerve conduction study today confirmed mild length dependent axonal sensorimotor polyneuropathy,  Potential etiology including his previous 35 years of heavy alcohol use, A1c was 6.2,  Chronic low back pain, radiating pain to right hip,   MRI of lumbar showed severe L3-4 stenosis,  X-ray of right hip showed no acute abnormality, Protrusio acetabuli of the right hip.  His lower extremity symptoms and low back pain has improved with higher dose of gabapentin 300 mg up to 3 tablets 3 times a day, I have advised him if his symptoms continue to do well, may consider lower dose of gabapentin to avoid the side effect  Gait abnormality  Hyperreflexia of bilateral upper extremity and patella,  MRI of cervical spine to rule out cervical spondylitic myelopathy  DIAGNOSTIC DATA (LABS, IMAGING, TESTING) - I reviewed patient records, labs, notes, testing and imaging myself where available.   HISTORICAL  Todd Gallegos is a 65 year old male, seen in request by his primary care physician Dr. Radene Ou, [place], for evaluation of bilateral feet paresthesia, initial evaluation was on February 23, 2019.  I have reviewed and summarized the referring note from the referring physician.  He had a past medical history of depression, currently on stable dose of Lexapro 20 mg daily, Wellbutrin XL 300 mg daily, also had a history of hypertension, prediabetes, most recent A1c was 6.2, he reported 35 years history of heavy alcohol use, quitted in 2015  Around 2015, he noticed mild bilateral feet and toes paresthesia, mainly involving plantar surface, numbness tingling, gradually getting worse over the past 5 years, now at evening time, when he tried to relax, sitting down watching TV, he would have burning pain of his bilateral feet, difficult to sit still, sometimes toes purplish reddish discoloration,  He denies significant gait  abnormality, able to run 2 to 3 miles without difficulty, but with running, he noticed burning sensation extending to lateral shin level, over the past 10 years, he also noticed bilateral shin lateral mass, soft, nontender to touch, stable in size, but was running, it becomes sensitive, burning pain.  He denies bilateral upper extremity involvement, he denies neck pain,  Laboratory evaluations in December 2020 showed elevated A1c 6.2, normal T90, folic acid, TSH, CMP showed mild abnormal liver functional test, normal CBC hemoglobin of 14  UPDATE April 04 2019: Patient return for electrodiagnostic study today, which showed evidence of mild axonal sensorimotor polyneuropathy, there is no evidence of bilateral lumbosacral radiculopathy.  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, wear a hip brace for few years  Laboratory evaluation showed normal negative ANA, ESR, C-reactive protein, protein electrophoresis,  Ultrasound of bilateral lower extremities soft tissue examination showed no clinical concerns bilaterally  Update December 22, 2020 Personally reviewed MRI lumbar in April 2021, multilevel degenerative changes, most obvious at L3-4, there is subligamentous disc herniation, prominent facet hypertrophy, with moderately severe canal stenosis, bilateral foraminal narrowing,  EMG nerve conduction study in March also confirmed mild bilateral axonal sensorimotor polyneuropathy,  Laboratory evaluation showed normal ESR C-reactive protein ANA, RPR, protein electrophoresis,  prediabetes, with mild elevated A1c 6-6.2,  His low back pain has  improved with nerve ablation, he has stayed on higher dose of gabapentin 300 mg up to 3 tablets 3 times a day, which has helped his evening time foot paresthesia, he can sleep much better, he also use diclofenac gel as needed which is also helpful  He denies significant gait abnormality, denies bowel bladder incontinence,   PHYSICAL EXAM   Vitals:   12/22/20 0845  BP: (!) 152/84  Pulse: 76  Weight: 203 lb 8 oz (92.3 kg)  Height: 5' 9"  (1.753 m)   Not recorded     Body mass index is 30.05 kg/m.  PHYSICAL EXAMNIATION:  NEUROLOGICAL EXAM:  MENTAL STATUS: Speech/cognition Awake, alert, oriented to history taking care of conversation   CRANIAL NERVES: CN II: Visual fields are full to confrontation. Pupils are round equal and briskly reactive to light. CN III, IV, VI: extraocular movement are normal. No ptosis. CN V: Facial sensation is intact to light touch CN VII: Face is symmetric with normal eye closure  CN VIII: Hearing is normal to causal conversation. CN IX, X: Phonation is normal. CN XI: Head turning and shoulder shrug are intact  MOTOR: There is no pronator drift of out-stretched arms. Muscle bulk and tone are normal. Muscle strength is normal.  REFLEXES: Reflexes are 3 and symmetric at the biceps, triceps, 3/3 knees, and trace at ankles. Plantar responses are flexor.  SENSORY: Length dependent decreased to light touch, pinprick and vibratory sensation to ankle level  COORDINATION: No limb dysmetria noted  GAIT/STANCE: Mildly unsteady,  REVIEW OF SYSTEMS: Full 14 system review of systems performed and notable only for as above All other review of systems were negative.  ALLERGIES: No Known Allergies  HOME MEDICATIONS: Current Outpatient Medications  Medication Sig Dispense Refill   atorvastatin (LIPITOR) 20 MG tablet Take 20 mg by mouth daily.     benazepril-hydrochlorthiazide (LOTENSIN HCT) 20-25 MG tablet Take 1 tablet by mouth daily.     buPROPion  (WELLBUTRIN XL) 300 MG 24 hr tablet Take 300 mg by mouth daily.     Coenzyme Q10 (CO Q 10 PO) Take 60 mg by mouth daily.     diclofenac Sodium (VOLTAREN) 1 % GEL Apply 2 g topically 4 (four) times daily as needed. 700 g 4   escitalopram (LEXAPRO) 20 MG tablet Take 20 mg by mouth daily.     ferrous sulfate 325 (65 FE) MG tablet Take 1 tablet by mouth See admin instructions.     gabapentin (NEURONTIN) 300 MG capsule Take 3 capsules (900 mg total) by mouth 3 (three) times daily. 810 capsule 3   Glucosamine-Chondroit-Vit C-Mn (GLUCOSAMINE 1500 COMPLEX PO) Take 1 tablet by mouth daily.     ibuprofen (ADVIL) 200 MG tablet Take 200 mg by mouth 2 (two) times daily. Three tablets, twice daily     Nutritional Supplements (CREATINE) 750 MG CAPS Take 1 tablet by mouth See admin instructions.     omeprazole (PRILOSEC) 20 MG capsule Take 20 mg by mouth daily.     testosterone cypionate (DEPOTESTOSTERONE CYPIONATE) 200 MG/ML injection Inject 200 mg into the muscle every 7 (seven) days.     No current facility-administered medications for this visit.    PAST MEDICAL HISTORY: Past Medical History:  Diagnosis Date   Acid reflux    Diabetes ([place])    Per PCP records, never had abnl HgA1c - highest fasting glucose 115 - seems more metabolic syndrome  GAD (generalized anxiety disorder)    Hepatic steatosis    History of alcohol use    heavy   Hypercholesteremia    Hypertension    Neuropathy    Perthes disease    childhood    PAST SURGICAL HISTORY: Past Surgical History:  Procedure Laterality Date   CARDIAC CATHETERIZATION     COLONOSCOPY     x 2    FAMILY HISTORY: Family History  Problem Relation Age of Onset   Asthma Mother    Hypercholesterolemia Mother    Hypertension Mother    Colon cancer Father    Hypercholesterolemia Father    Hypertension Father    CAD Father     SOCIAL HISTORY: Social History   Socioeconomic History   Marital status: Married    Spouse name: Not on file    Number of children: 2   Years of education: some college   Highest education level: Not on file  Occupational History   Occupation: Government social research officer  Tobacco Use   Smoking status: Former   Smokeless tobacco: Former  Substance and Sexual Activity   Alcohol use: Not Currently    Comment: hx of heavy use prior to 2016   Drug use: Never   Sexual activity: Not on file  Other Topics Concern   Not on file  Social History Narrative   Caffeine: 1 pot coffee per day   Lives with wife   Right handed   Social Determinants of Health   Financial Resource Strain: Not on file  Food Insecurity: Not on file  Transportation Needs: Not on file  Physical Activity: Not on file  Stress: Not on file  Social Connections: Not on file  Intimate Partner Violence: Not on file      Marcial Pacas, M.D. Ph.D.  [facility] [address], [place], [place] [address] Ph: [phone] Fax: [phone]  CC: Aretta Nip, MD  Addendum, I reviewed [facility] neurosurgery evaluation from Dr. 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 Katherine Roan advised aggressive medical management including medication treatment, physical therapy, escalation to epidural steroid injection, if the radicular/neurogenic claudication system persisted despite aggressive medical management, then surgical intervention such as decompressive laminectomy with medial facetectomy at L3-4 levels could be considered

## 2020-12-22 NOTE — Telephone Encounter (Signed)
Per patient request's [facility]. I faxed the order they will reach out to the patient to schedule.

## 2020-12-23 NOTE — Telephone Encounter (Signed)
schedule for 12/14 445pm at [facility].

## 2020-12-29 NOTE — Telephone Encounter (Signed)
error 

## 2021-01-01 ENCOUNTER — Telehealth: Payer: Self-pay | Admitting: Neurology

## 2021-01-01 DIAGNOSIS — M4802 Spinal stenosis, cervical region: Secondary | ICD-10-CM

## 2021-01-01 NOTE — Telephone Encounter (Signed)
I failed to reach patient by two numbers listed, left message for him to call back, Please call patient, severe spinal stenosis  at C6-7  refer to neurosurgeon,   IMPRESSION:  1.  Cervical spondylosis and facet arthrosis as described above. This is most notable for severe spinal canal and severe bilateral neuroforaminal stenosis at C6-C7.  2.  Severe bilateral neuroforaminal stenosis and moderate spinal canal narrowing at C4-C5 and C5-C6.  3.  Moderate neuroforaminal narrowing and mild spinal canal narrowing at C3-C4.

## 2021-01-02 NOTE — Telephone Encounter (Addendum)
I spoke to the patient. He verbalized understanding of his MRI cervical findings. He is agreeable to the referral to neurosurgery. He has previously seen Dr. Petra Kuba at [facility] in [place]. He would like to go back to her again.

## 2021-01-05 NOTE — Telephone Encounter (Signed)
Referral has been sent to [facility]) for Dr. Petra Kuba. Phone: [phone].

## 2021-04-28 ENCOUNTER — Encounter: Payer: Self-pay | Admitting: Neurology

## 2021-05-04 NOTE — Telephone Encounter (Signed)
Please call patient MRI of lumbar spine showed severe spinal stenosis at L3-4 level, cervical spinal stenosis at C6-7 level ? ?I do think above significant cervical and lumbar degenerative changes contributed to his current complaints of bilateral lower extremity paresthesia and pain ? ?I think solving the problem is more important than just treat the symptoms alone, with spinal stimulator procedure. ? ?If he has unbearable neuropathic pain, may give him the earlier follow-up appointment, advised him to bring MRI of cervical spine from outside hospital to review, ? ? ? ?IMPRESSION: Abnormal MRI scan of the lumbar spine without contrast showing mild spondylitic changes throughout most prominent at L3-4 where there is subligamentous disc herniation as well as prominent facet hypertrophy resulting in severe canal as well as bilateral foraminal narrowing with likely encroachment on the exiting nerve roots.  There is mild bilateral foraminal narrowing at L2-3, and were necessary to as well as L4-5 as well. ? ?MRI of cervical spine December 25, 2020 from [facility] ? 1.  Cervical spondylosis and facet arthrosis as described above. This is most notable for severe spinal canal and severe bilateral neuroforaminal stenosis at C6-C7.  ?2.  Severe bilateral neuroforaminal stenosis and moderate spinal canal narrowing at C4-C5 and C5-C6.  ?3.  Moderate neuroforaminal narrowing and mild spinal canal narrowing at C3-C4.  ?

## 2021-05-04 NOTE — Telephone Encounter (Signed)
I reviewed the information below with the patient. He is agreeable to come in for an earlier appt and will bring the cervical MRI completed at [facility]. Rescheduled to 05/14/21.  ?

## 2021-05-14 ENCOUNTER — Ambulatory Visit (INDEPENDENT_AMBULATORY_CARE_PROVIDER_SITE_OTHER): Admitting: Neurology

## 2021-05-14 ENCOUNTER — Encounter: Payer: Self-pay | Admitting: Neurology

## 2021-05-14 VITALS — BP 132/71 | HR 76 | Ht 69.0 in | Wt 201.0 lb

## 2021-05-14 DIAGNOSIS — M792 Neuralgia and neuritis, unspecified: Secondary | ICD-10-CM

## 2021-05-14 DIAGNOSIS — M48061 Spinal stenosis, lumbar region without neurogenic claudication: Secondary | ICD-10-CM | POA: Diagnosis not present

## 2021-05-14 DIAGNOSIS — M4802 Spinal stenosis, cervical region: Secondary | ICD-10-CM

## 2021-05-14 MED ORDER — GABAPENTIN 300 MG PO CAPS: 900.0000 mg | ORAL_CAPSULE | Freq: Three times a day (TID) | ORAL | 3 refills | Status: DC

## 2021-05-14 MED ORDER — DULOXETINE HCL 60 MG PO CPEP: 60.0000 mg | ORAL_CAPSULE | Freq: Every day | ORAL | 11 refills | Status: DC

## 2021-05-14 MED ORDER — DICLOFENAC SODIUM 1 % EX GEL: 2.0000 g | Freq: Four times a day (QID) | CUTANEOUS | 4 refills | Status: AC | PRN

## 2021-05-14 NOTE — Progress Notes (Signed)
? ? ?ASSESSMENT AND PLAN ? ?Todd Gallegos is a 66 y.o. male   ?Peripheral neuropathy ? EMG nerve conduction study today confirmed mild length dependent axonal sensorimotor polyneuropathy, mainly small fiber involvement ? Potential etiology including his previous 35 years of heavy alcohol use, A1c was 6.2, ? Significant neuropathic pain especially at evening time, despite gabapentin up to 300 mg 3 tablets 3 times a day, diclofenac gel, ? Add on Cymbalta 60 mg daily ? He will see pain management for potential spinal stimulator placement ? ?Cervical spondylitic myelopathy, gait abnormality ? Status post anterior C3-7 decompression in March 2023 at [facility], recovering well, ? Continues to have mild residual unsteady gait, hyperreflexia of bilateral knee, no incontinence, ? ?Lumbar stenosis, chronic low back pain ? MRI of lumbar showed severe L3-4 stenosis, ? X-ray of right hip  showed no acute abnormality, Protrusio acetabuli of the right hip. ? His low back pain has improved with higher dose of gabapentin, back stretching exercise ? ?DIAGNOSTIC DATA (LABS, IMAGING, TESTING) ?- I reviewed patient records, labs, notes, testing and imaging myself where available. ? ? ?HISTORICAL ? ?Todd Gallegos is a 66 year old male, seen in request by his primary care physician Dr. Radene Ou, [place], for evaluation of bilateral feet paresthesia, initial evaluation was on February 23, 2019. ? ?I have reviewed and summarized the referring note from the referring physician.  He had a past medical history of depression, currently on stable dose of Lexapro 20 mg daily, Wellbutrin XL 300 mg daily, also had a history of hypertension, prediabetes, most recent A1c was 6.2, he reported 35 years history of heavy alcohol use, quitted in 2015 ? ?Around 2015, he noticed mild bilateral feet and toes paresthesia, mainly involving plantar surface, numbness tingling, gradually getting worse over the past 5 years, now at evening time, when he tried  to relax, sitting down watching TV, he would have burning pain of his bilateral feet, difficult to sit still, sometimes toes purplish reddish discoloration, ? ?He denies significant gait abnormality, able to run 2 to 3 miles without difficulty, but with running, he noticed burning sensation extending to lateral shin level, over the past 10 years, he also noticed bilateral shin lateral mass, soft, nontender to touch, stable in size, but was running, it becomes sensitive, burning pain. ? ?He denies bilateral upper extremity involvement, he denies neck pain, ? ?Laboratory evaluations in December 2020 showed elevated A1c 6.2, normal A35, folic acid, TSH, CMP showed mild abnormal liver functional test, normal CBC hemoglobin of 14 ? ?UPDATE April 04 2019: ?Patient return for electrodiagnostic study today, which showed evidence of mild axonal sensorimotor polyneuropathy, there is no evidence of bilateral lumbosacral radiculopathy. ? ?He had noticeable bilateral plantar surface, toes purplish discoloration, he complains of stinging discomfort, gabapentin 100 mg 3 tablets at evening time was able to take the edge off, but he continues to have difficulty sleeping, frequent leg kicking movement, ? 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 ?Laboratory evaluation showed normal negative ANA, ESR, C-reactive protein, protein electrophoresis, ? ?Ultrasound of bilateral lower extremities soft tissue examination showed no clinical concerns bilaterally ? ?Update December 22, 2020 ?Personally reviewed MRI lumbar in April 2021, multilevel degenerative changes, most obvious at L3-4, there is subligamentous disc herniation, prominent facet hypertrophy, with moderately severe canal stenosis, bilateral foraminal narrowing, ? ?EMG nerve conduction study in  March also confirmed mild bilateral axonal  sensorimotor polyneuropathy, ? ?Laboratory evaluation showed normal ESR C-reactive protein ANA, RPR, protein electrophoresis, prediabetes, with mild elevated A1c 6-6.2, ? ?His low back pain has improved with nerve ablation, he has stayed on higher dose of gabapentin 300 mg up to 3 tablets 3 times a day, which has helped his evening time foot paresthesia, he can sleep much better, he also use diclofenac gel as needed which is also helpful ? ?He denies significant gait abnormality, denies bowel bladder incontinence, ? ?UPDATE May 14 2021: ?He underwent C3-7 posterior fusion by neurosurgeon Dr. Katherine Roan on March 31, 2021, at [facility] prior to surgery, he has worsening neck pain, arm paresthesia, weakness ?prior to cervical decompression surgery, he is recovering well ? ?But he continue has mild gait abnormality, denies bowel and bladder incontinence. ? ?He has known L3-4 severe stenosis, under reasonable control after radiofrequency ablation procedure, and higher dose of gabapentin ? ?The most bothersome symptoms is evening time bilateral feet, especially the arch area burning needle pricking pain, despite higher dose of gabapentin 300 mg 3 tablets 3 times a day, diclofenac gel as needed was helpful, ? ? ? ?PHYSICAL EXAM ?  ?Vitals:  ? 12/22/20 0845  ?BP: (!) 152/84  ?Pulse: 76  ?Weight: 203 lb 8 oz (92.3 kg)  ?Height: 5' 9"  (1.753 m)  ? ?Not recorded ?  ? ? ?Body mass index is 30.05 kg/m?. ? ?PHYSICAL EXAMNIATION: ? ?NEUROLOGICAL EXAM: ? ?MENTAL STATUS: ?Speech/cognition ?Awake, alert, oriented to history taking care of conversation ?  ?CRANIAL NERVES: ?CN II: Visual fields are full to confrontation. Pupils are round equal and briskly reactive to light. ?CN III, IV, VI: extraocular movement are normal. No ptosis. ?CN V: Facial sensation is intact to light touch ?CN VII: Face is symmetric with normal eye closure  ?CN VIII: Hearing is normal to causal conversation. ?CN IX, X: Phonation is normal. ?CN XI: Head  turning and shoulder shrug are intact ? ?MOTOR: ?There is no pronator drift of out-stretched arms. Muscle bulk and tone are normal. Muscle strength is normal. ? ?REFLEXES: ?Reflexes are 3 and symmetric at the biceps, triceps, 3/3 knees, and trace at ankles. Plantar responses are flexor. ? ?SENSORY: ?Length dependent decreased to light touch, pinprick and vibratory sensation to ankle level ? ?COORDINATION: ?No limb dysmetria noted ? ?GAIT/STANCE: ?Mildly unsteady, ? ?REVIEW OF SYSTEMS: Full 14 system review of systems performed and notable only for as above ?All other review of systems were negative. ? ?ALLERGIES: ?No Known Allergies ? ?HOME MEDICATIONS: ?Current Outpatient Medications  ?Medication Sig Dispense Refill  ? atorvastatin (LIPITOR) 20 MG tablet Take 20 mg by mouth daily.    ? benazepril-hydrochlorthiazide (LOTENSIN HCT) 20-25 MG tablet Take 1 tablet by mouth daily.    ? buPROPion (WELLBUTRIN XL) 300 MG 24 hr tablet Take 300 mg by mouth daily.    ? Coenzyme Q10 (CO Q 10 PO) Take 60 mg by mouth daily.    ? diclofenac Sodium (VOLTAREN) 1 % GEL Apply 2 g topically 4 (four) times daily as needed. 700 g 4  ? escitalopram (LEXAPRO) 20 MG tablet Take 20 mg by mouth daily.    ? ferrous sulfate 325 (65 FE) MG tablet Take 1 tablet by mouth See admin instructions.    ? gabapentin (NEURONTIN) 300 MG capsule Take 3 capsules (900 mg total) by mouth 3 (three) times daily. 810 capsule 3  ? Glucosamine-Chondroit-Vit C-Mn (GLUCOSAMINE 1500 COMPLEX PO) Take 1 tablet by mouth  daily.    ? ibuprofen (ADVIL) 200 MG tablet Take 200 mg by mouth 2 (two) times daily. Three tablets, twice daily    ? lidocaine-prilocaine (EMLA) cream Apply 1 application topically as needed. 1 gram qid prn 120 g 3  ? methocarbamol (ROBAXIN) 750 MG tablet Take 750 mg by mouth every 6 (six) hours as needed.    ? Nutritional Supplements (CREATINE) 750 MG CAPS Take 1 tablet by mouth See admin instructions.    ? omeprazole (PRILOSEC) 20 MG capsule Take 20  mg by mouth daily.    ? testosterone cypionate (DEPOTESTOSTERONE CYPIONATE) 200 MG/ML injection Inject 200 mg into the muscle every 7 (seven) days.    ? ?No current facility-administered medications for [facility]

## 2021-06-07 IMAGING — US US EXTREM LOW*L* LIMITED
1 series · 11 of 11 positions shown · non-contrast
Comparison: None

CLINICAL DATA: Soft tissue mass at shins

EXAM:
ULTRASOUND BILATERAL LOWER EXTREMITY LIMITED
TECHNIQUE: Ultrasound examination of the lower extremity soft tissues was
performed in the areas of clinical concern.

[Series 1: us extrem low*left* limited · 0.06mm/px · 11 of 11 slices shown]
[im 1/11]
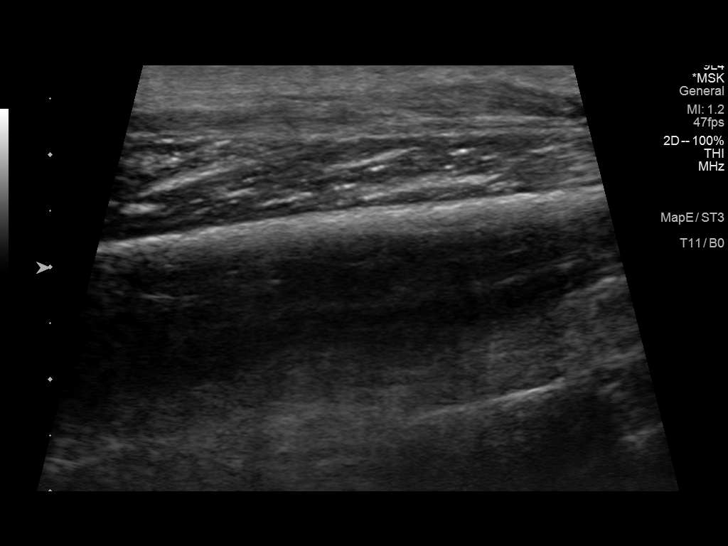
[im 2/11]
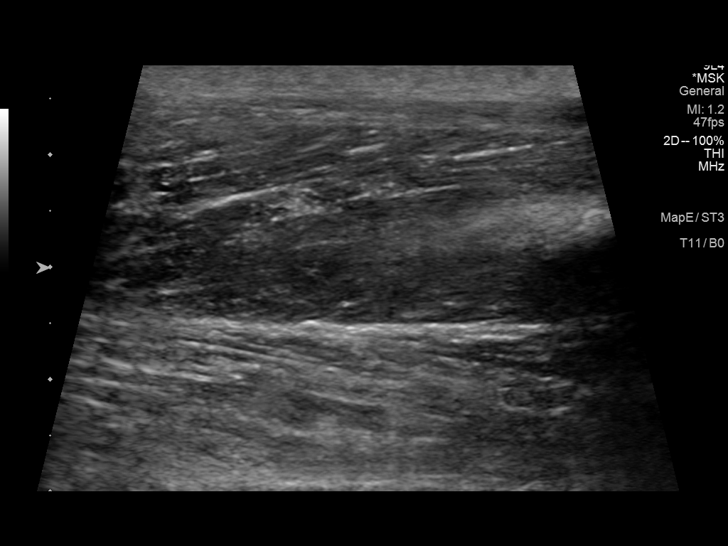
[im 3/11]
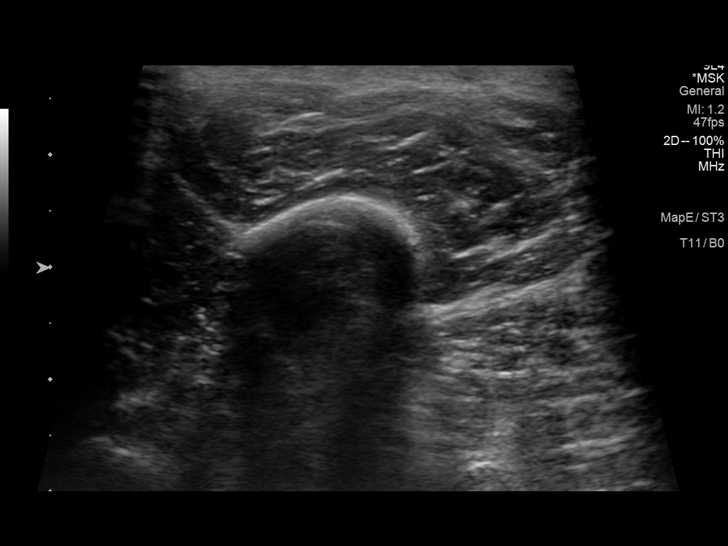
[im 4/11]
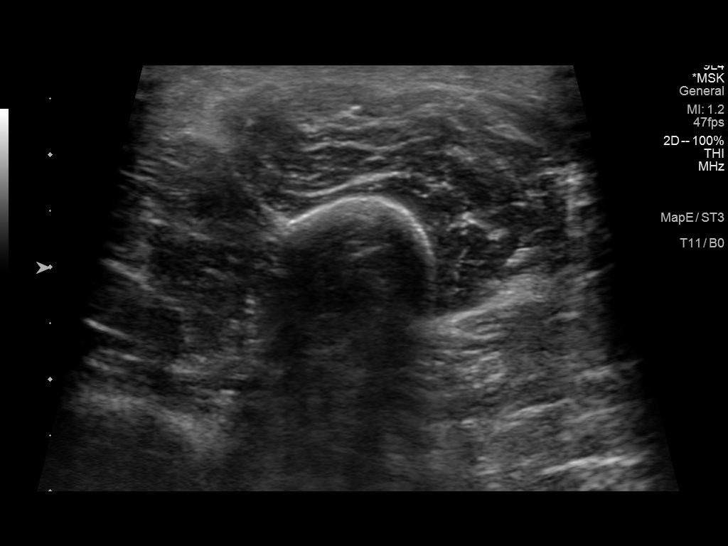
[im 5/11]
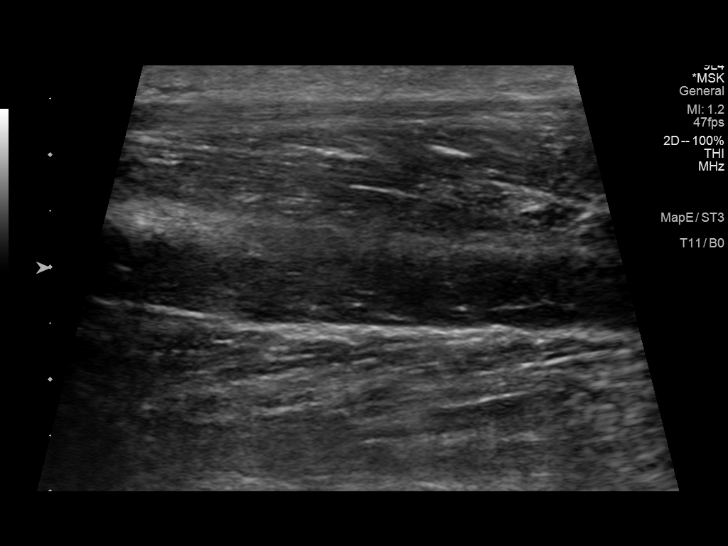
[im 6/11]
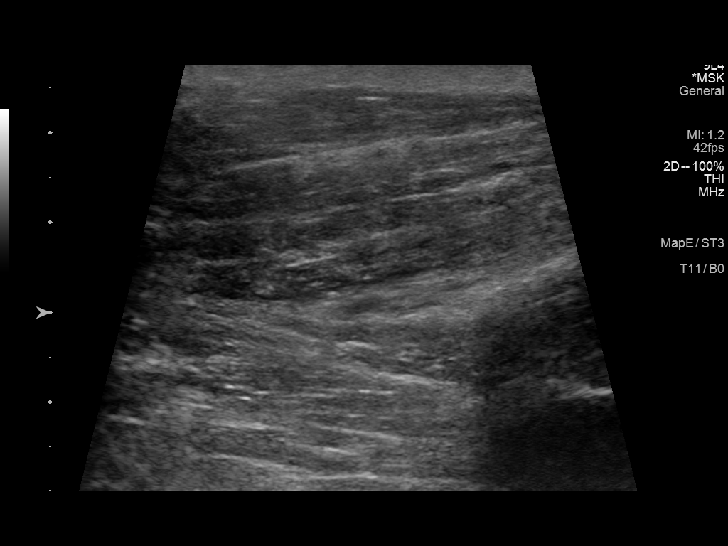
[im 7/11]
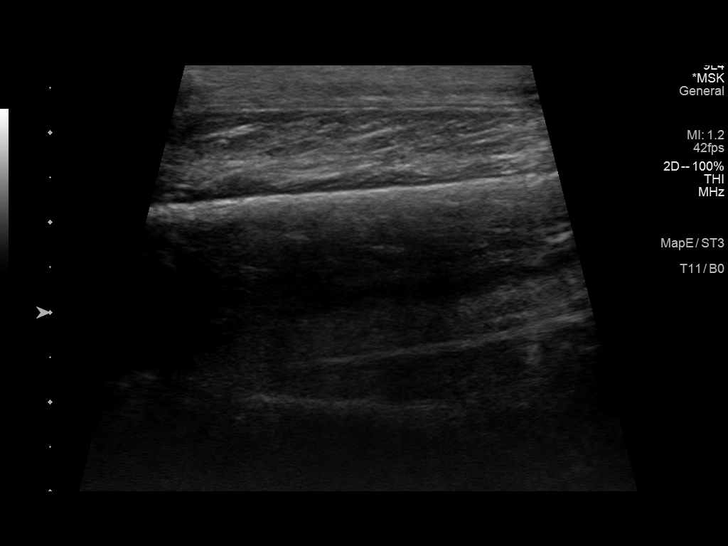
[im 8/11]
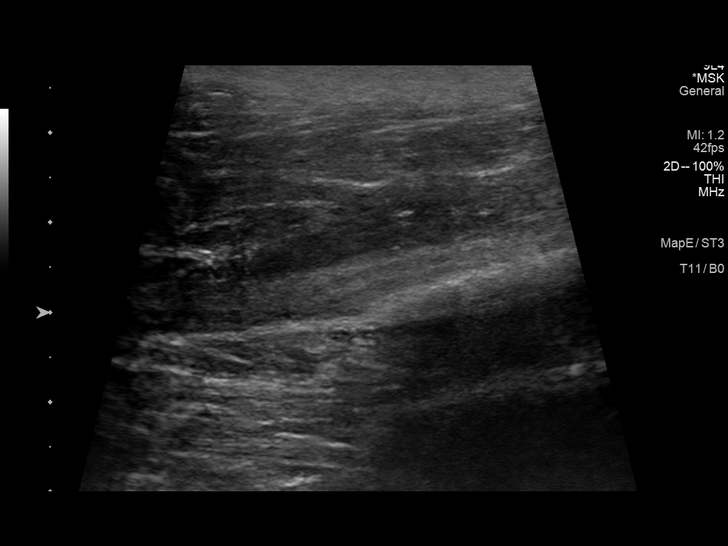
[im 9/11]
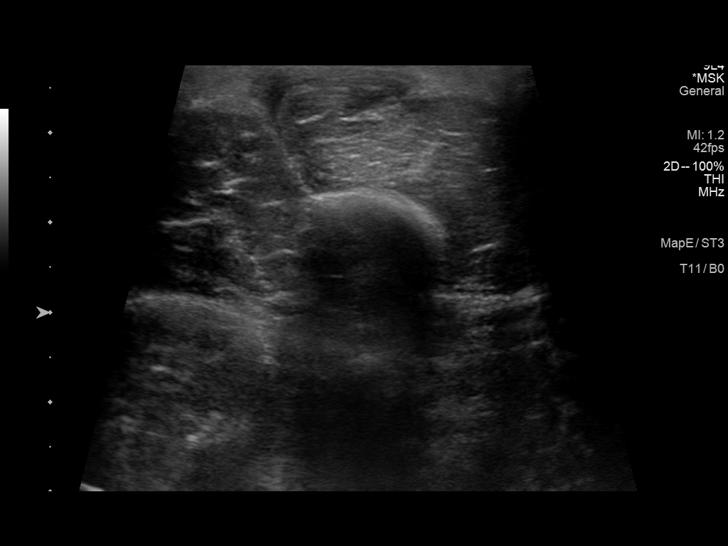
[im 10/11]
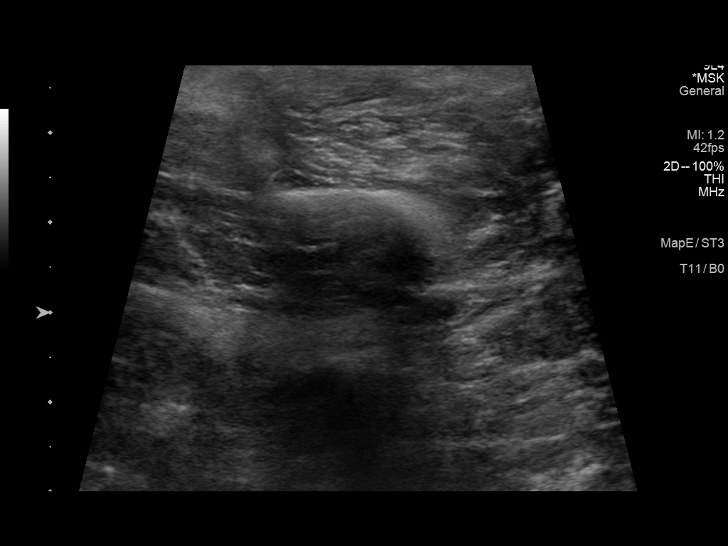
[im 11/11]
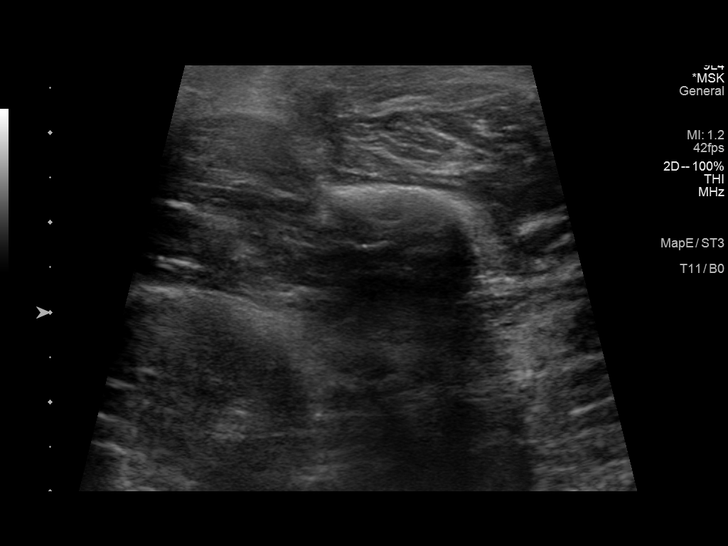

[11 of 11 positions shown; findings below may reference images not displayed]

FINDINGS: Sonography was performed at sites of clinical concern at the lower
legs bilaterally.

No soft tissue mass is identified.

No evidence of cyst, mass, calcification, or hematoma.

Sonographically the soft tissues at the sites of clinical concern
are unremarkable.
IMPRESSION: Negative ultrasound of sites of clinical concern at the lower legs
bilaterally.

## 2021-06-22 ENCOUNTER — Ambulatory Visit: Admitting: Adult Health

## 2021-07-09 ENCOUNTER — Other Ambulatory Visit: Payer: Self-pay

## 2021-07-09 MED ORDER — DULOXETINE HCL 60 MG PO CPEP: 60.0000 mg | ORAL_CAPSULE | Freq: Every day | ORAL | 3 refills | Status: DC

## 2021-07-09 NOTE — Progress Notes (Signed)
Rx refilled.

## 2021-08-26 DIAGNOSIS — G609 Hereditary and idiopathic neuropathy, unspecified: Secondary | ICD-10-CM | POA: Insufficient documentation

## 2021-10-05 DIAGNOSIS — M47816 Spondylosis without myelopathy or radiculopathy, lumbar region: Secondary | ICD-10-CM | POA: Insufficient documentation

## 2021-11-27 ENCOUNTER — Telehealth: Payer: Self-pay | Admitting: Neurology

## 2021-11-27 NOTE — Telephone Encounter (Signed)
Pt request refill for DULoxetine (CYMBALTA) 60 MG capsule at  [facility] DELIVERY

## 2021-11-30 NOTE — Telephone Encounter (Signed)
I spoke with the patient and informed him that refills are on file for his medication. He verbalized understanding and expressed appreciation for the call. All questions answered.

## 2022-01-04 MED ORDER — DULOXETINE HCL 60 MG PO CPEP: 60.0000 mg | ORAL_CAPSULE | Freq: Every day | ORAL | 3 refills | Status: DC

## 2022-01-04 NOTE — Telephone Encounter (Signed)
[facility]

## 2022-01-04 NOTE — Telephone Encounter (Signed)
Pt called and LVM stating that he thinks [facility] has not received the prescription because he is still waiting on his medication to be mailed out. Please advise.

## 2022-01-04 NOTE — Addendum Note (Signed)
Addended by: Ann Maki on: 01/04/2022 04:37 PM   Modules accepted: Orders

## 2022-01-06 DIAGNOSIS — M47816 Spondylosis without myelopathy or radiculopathy, lumbar region: Secondary | ICD-10-CM | POA: Insufficient documentation

## 2022-01-06 NOTE — Telephone Encounter (Signed)
Called pt back. Informed him that updated RX has been sent to [facility]. He said thank you so much.

## 2022-02-24 ENCOUNTER — Other Ambulatory Visit: Payer: Self-pay | Admitting: Family Medicine

## 2022-02-24 ENCOUNTER — Ambulatory Visit
Admission: RE | Admit: 2022-02-24 | Discharge: 2022-02-24 | Disposition: A | Discharge status: Home / Self Care | Source: Ambulatory Visit | Attending: Family Medicine | Admitting: Family Medicine

## 2022-02-24 DIAGNOSIS — S90415A Abrasion, left lesser toe(s), initial encounter: Secondary | ICD-10-CM

## 2022-02-25 ENCOUNTER — Encounter: Payer: Self-pay | Admitting: Podiatry

## 2022-02-25 ENCOUNTER — Ambulatory Visit: Admitting: Podiatry

## 2022-02-25 DIAGNOSIS — I7301 Raynaud's syndrome with gangrene: Secondary | ICD-10-CM | POA: Diagnosis not present

## 2022-02-25 DIAGNOSIS — I776 Arteritis, unspecified: Secondary | ICD-10-CM | POA: Diagnosis not present

## 2022-02-25 MED ORDER — PREDNISONE 5 MG PO TABS: ORAL_TABLET | ORAL | 0 refills | Status: AC

## 2022-02-25 MED ORDER — MUPIROCIN 2 % EX OINT: 1.0000 | TOPICAL_OINTMENT | Freq: Two times a day (BID) | CUTANEOUS | 2 refills | Status: DC

## 2022-02-25 MED ORDER — "NITROGLYCERIN NICU 2% OINTMENT ": 1.0000 | TOPICAL_OINTMENT | Freq: Two times a day (BID) | TRANSDERMAL | 0 refills | Status: DC

## 2022-02-25 NOTE — Patient Instructions (Signed)
Call [phone] to schedule your vascular testing:  [facility] [address], [place], [place] [address]  

## 2022-02-28 NOTE — Progress Notes (Signed)
  Subjective:  Patient ID: Todd Gallegos, male    DOB: Sep 25, 1955,  MRN: [id number]  Chief Complaint  Patient presents with   Foot Ulcer    (New Patient) Diagnosis: Skin ulcer of toe, limited to breakdown of skin Referring Provider: Lurline Del   Peripheral Neuropathy    Idiopathic    67 y.o. male presents with the above complaint. History confirmed with patient.  He was referred by his PCP there was concerns that he is developing ulcers on his toes.  He is nondiabetic.  He does not smoke.  Objective:  Physical Exam: normal DP and PT pulses, normal sensory exam, and despite palpable pulses the foot is fairly cool to touch from the midfoot distal.  Cold at the toes.  He has skin breakdown of the right third toe tip with eschar.  Appears to be consistent with a Raynaud's phenomenon   I reviewed his radiographs from 02/24/2022 there is no evidence of an osteomyelitis Assessment:   1. 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  I reviewed my clinical exam findings with the patient I discussed with him that this is likely a vasculitis with ulceration or possible Raynaud's with early gangrenous changes.  I recommended evaluating with laboratory markers for vasculitis.  I placed him on a prednisone taper for inflammatory control as well.  Begin local wound care with mupirocin ointment.  ABIs also ordered for Raynaud's evaluation.  I would like to see him back in 1 month for follow-up.  I discussed the possibility of worsening infection advised to keep his feet as warm as possible wearing good warm socks.  Currently no signs of infection or deep tissue ulceration which appears to be only limited to breakdown of the skin, hopefully can heal once this is addressed.  May need to start amlodipine.  Nitro-Bid ointment prescribed as well.  We discussed its use and possible side effects including headache  No  follow-ups on file.

## 2022-03-03 ENCOUNTER — Other Ambulatory Visit (HOSPITAL_COMMUNITY): Payer: Self-pay | Admitting: Family Medicine

## 2022-03-03 ENCOUNTER — Other Ambulatory Visit: Payer: Self-pay | Admitting: Podiatry

## 2022-03-03 DIAGNOSIS — I776 Arteritis, unspecified: Secondary | ICD-10-CM

## 2022-03-03 DIAGNOSIS — I7301 Raynaud's syndrome with gangrene: Secondary | ICD-10-CM

## 2022-03-03 DIAGNOSIS — L97501 Non-pressure chronic ulcer of other part of unspecified foot limited to breakdown of skin: Secondary | ICD-10-CM

## 2022-03-03 LAB — CBC WITH DIFFERENTIAL/PLATELET
Basophils Absolute: 0 10*3/uL (ref 0.0–0.2)
Basos: 0 %
EOS (ABSOLUTE): 0.1 10*3/uL (ref 0.0–0.4)
Eos: 1 %
Hematocrit: 48.8 % (ref 37.5–51.0)
Hemoglobin: 16.4 g/dL (ref 13.0–17.7)
Immature Grans (Abs): 0 10*3/uL (ref 0.0–0.1)
Immature Granulocytes: 0 %
Lymphocytes Absolute: 1.9 10*3/uL (ref 0.7–3.1)
Lymphs: 29 %
MCH: 33.5 pg — ABNORMAL HIGH (ref 26.6–33.0)
MCHC: 33.6 g/dL (ref 31.5–35.7)
MCV: 100 fL — ABNORMAL HIGH (ref 79–97)
Monocytes Absolute: 0.6 10*3/uL (ref 0.1–0.9)
Monocytes: 10 %
Neutrophils Absolute: 3.8 10*3/uL (ref 1.4–7.0)
Neutrophils: 60 %
Platelets: 365 10*3/uL (ref 150–450)
RBC: 4.89 x10E6/uL (ref 4.14–5.80)
RDW: 12.6 % (ref 11.6–15.4)
WBC: 6.4 10*3/uL (ref 3.4–10.8)

## 2022-03-03 LAB — C-REACTIVE PROTEIN: CRP: 4 mg/L (ref 0–10)

## 2022-03-03 LAB — ANCA PROFILE
Anti-MPO Antibodies: <0.2 units (ref 0.0–0.9)
Anti-PR3 Antibodies: <0.2 units (ref 0.0–0.9)
Atypical pANCA: <1:20 {titer}
C-ANCA: <1:20 {titer}
P-ANCA: <1:20 {titer}

## 2022-03-03 LAB — SEDIMENTATION RATE: Sed Rate: 4 mm/hr (ref 0–30)

## 2022-03-12 ENCOUNTER — Ambulatory Visit (HOSPITAL_BASED_OUTPATIENT_CLINIC_OR_DEPARTMENT_OTHER)
Admission: RE | Admit: 2022-03-12 | Discharge: 2022-03-12 | Disposition: A | Discharge status: Home / Self Care | Source: Ambulatory Visit | Attending: Family Medicine | Admitting: Family Medicine

## 2022-03-12 ENCOUNTER — Ambulatory Visit (HOSPITAL_COMMUNITY)
Admission: RE | Admit: 2022-03-12 | Discharge: 2022-03-12 | Disposition: A | Discharge status: Home / Self Care | Source: Ambulatory Visit | Attending: Cardiovascular Disease | Admitting: Cardiovascular Disease

## 2022-03-12 DIAGNOSIS — I776 Arteritis, unspecified: Secondary | ICD-10-CM

## 2022-03-12 DIAGNOSIS — I10 Essential (primary) hypertension: Secondary | ICD-10-CM

## 2022-03-12 DIAGNOSIS — L97501 Non-pressure chronic ulcer of other part of unspecified foot limited to breakdown of skin: Secondary | ICD-10-CM | POA: Diagnosis present

## 2022-03-12 DIAGNOSIS — I7301 Raynaud's syndrome with gangrene: Secondary | ICD-10-CM

## 2022-03-12 LAB — ECHOCARDIOGRAM COMPLETE
Calc EF: 51.4 %
MV M vel: 3.78 m/s
MV Peak grad: 57.2 mmHg
S' Lateral: 3.2 cm
Single Plane A2C EF: 54 %
Single Plane A4C EF: 48 %

## 2022-03-12 NOTE — Progress Notes (Signed)
  Echocardiogram 2D Echocardiogram has been performed.  Todd Gallegos 03/12/2022, 11:47 AM

## 2022-03-13 LAB — VAS US ABI WITH/WO TBI
Left ABI: 1.06
Right ABI: 1.1

## 2022-03-15 ENCOUNTER — Other Ambulatory Visit: Payer: Self-pay | Admitting: Podiatry

## 2022-03-18 ENCOUNTER — Encounter: Payer: Self-pay | Admitting: Podiatry

## 2022-03-25 ENCOUNTER — Ambulatory Visit: Admitting: Podiatry

## 2022-03-31 ENCOUNTER — Ambulatory Visit (INDEPENDENT_AMBULATORY_CARE_PROVIDER_SITE_OTHER): Admitting: Podiatry

## 2022-03-31 DIAGNOSIS — I7301 Raynaud's syndrome with gangrene: Secondary | ICD-10-CM

## 2022-04-01 NOTE — Progress Notes (Signed)
  Subjective:  Patient ID: Todd Gallegos, male    DOB: 01/04/1956,  MRN: [id number]  Chief Complaint  Patient presents with   Raynaud's    Raynaud's disease with gangrene    66 y.o. male presents with the above complaint. History confirmed with patient.  He returns for follow-up his toes are improving, has been using the mupirocin ointment, he completed the testing and lab work  Objective:  Physical Exam: normal DP and PT pulses, normal sensory exam, and despite palpable pulses the foot is fairly cool to touch from the midfoot distal.  Cool at the toes.  Skin breakdown of right third toe tip is improving, healing well no signs of infection.  Appears to be consistent with a Raynaud's phenomenon      I reviewed his radiographs from 02/24/2022 there is no evidence of an osteomyelitis  ABIs normal  Sed rate, CRP, ANCA profile and CBC were unrevealing  Assessment:   1. Raynaud's disease with gangrene [facility])      Plan:  Patient was evaluated and treated and all questions answered.  We reviewed the results of his vascular testing and lab work, overall was unrevealing, I do think that this is consistent with Raynaud's syndrome with ulceration.  Recommend continuing to warm the feet using mupirocin ointment.  He does not feel that the Nitro-Bid ointment has helped much.  He may utilize this as needed.  I will see him back in 1 month for follow-up  Return in about 1 month (around 05/01/2022) for wound / raynaud's follow up .

## 2022-06-29 ENCOUNTER — Other Ambulatory Visit (HOSPITAL_COMMUNITY): Payer: Self-pay | Admitting: Gastroenterology

## 2022-06-29 DIAGNOSIS — R748 Abnormal levels of other serum enzymes: Secondary | ICD-10-CM

## 2022-07-12 ENCOUNTER — Ambulatory Visit (HOSPITAL_COMMUNITY)

## 2022-07-27 ENCOUNTER — Other Ambulatory Visit: Payer: Self-pay | Admitting: Radiology

## 2022-07-27 DIAGNOSIS — M7989 Other specified soft tissue disorders: Secondary | ICD-10-CM

## 2022-07-28 ENCOUNTER — Encounter (HOSPITAL_COMMUNITY): Payer: Self-pay

## 2022-07-28 ENCOUNTER — Ambulatory Visit (HOSPITAL_COMMUNITY)
Admission: RE | Admit: 2022-07-28 | Discharge: 2022-07-28 | Disposition: A | Discharge status: Home / Self Care | Source: Ambulatory Visit | Attending: Gastroenterology | Admitting: Gastroenterology

## 2022-07-28 ENCOUNTER — Other Ambulatory Visit: Payer: Self-pay

## 2022-07-28 DIAGNOSIS — R7989 Other specified abnormal findings of blood chemistry: Secondary | ICD-10-CM | POA: Diagnosis present

## 2022-07-28 DIAGNOSIS — M7989 Other specified soft tissue disorders: Secondary | ICD-10-CM

## 2022-07-28 DIAGNOSIS — E1140 Type 2 diabetes mellitus with diabetic neuropathy, unspecified: Secondary | ICD-10-CM | POA: Diagnosis not present

## 2022-07-28 DIAGNOSIS — K760 Fatty (change of) liver, not elsewhere classified: Secondary | ICD-10-CM | POA: Diagnosis not present

## 2022-07-28 DIAGNOSIS — F411 Generalized anxiety disorder: Secondary | ICD-10-CM | POA: Insufficient documentation

## 2022-07-28 DIAGNOSIS — I10 Essential (primary) hypertension: Secondary | ICD-10-CM | POA: Diagnosis not present

## 2022-07-28 DIAGNOSIS — R748 Abnormal levels of other serum enzymes: Secondary | ICD-10-CM

## 2022-07-28 LAB — CBC
HCT: 44.9 % (ref 39.0–52.0)
Hemoglobin: 15.9 g/dL (ref 13.0–17.0)
MCH: 34.3 pg — ABNORMAL HIGH (ref 26.0–34.0)
MCHC: 35.4 g/dL (ref 30.0–36.0)
MCV: 97 fL (ref 80.0–100.0)
Platelets: 374 10*3/uL (ref 150–400)
RBC: 4.63 MIL/uL (ref 4.22–5.81)
RDW: 12.7 % (ref 11.5–15.5)
WBC: 6.6 10*3/uL (ref 4.0–10.5)
nRBC: 0 % (ref 0.0–0.2)

## 2022-07-28 LAB — PROTIME-INR
INR: 1 (ref 0.8–1.2)
Prothrombin Time: 13.2 seconds (ref 11.4–15.2)

## 2022-07-28 MED ORDER — FENTANYL CITRATE (PF) 100 MCG/2ML IJ SOLN
INTRAMUSCULAR | Status: AC
  Filled 2022-07-28: qty 4

## 2022-07-28 MED ORDER — SODIUM CHLORIDE 0.9 % IV SOLN: INTRAVENOUS | Status: DC

## 2022-07-28 MED ORDER — HYDROCODONE-ACETAMINOPHEN 5-325 MG PO TABS: 1.0000 | ORAL_TABLET | ORAL | Status: DC | PRN

## 2022-07-28 MED ORDER — MIDAZOLAM HCL 2 MG/2ML IJ SOLN
INTRAMUSCULAR | Status: AC | PRN
  Administered 2022-07-28: .5 mg via INTRAVENOUS
  Administered 2022-07-28: 1 mg via INTRAVENOUS

## 2022-07-28 MED ORDER — FENTANYL CITRATE (PF) 100 MCG/2ML IJ SOLN
INTRAMUSCULAR | Status: AC | PRN
  Administered 2022-07-28: 50 ug via INTRAVENOUS
  Administered 2022-07-28: 25 ug via INTRAVENOUS

## 2022-07-28 MED ORDER — MIDAZOLAM HCL 2 MG/2ML IJ SOLN
INTRAMUSCULAR | Status: AC
  Filled 2022-07-28: qty 4

## 2022-07-28 MED ORDER — LIDOCAINE HCL (PF) 1 % IJ SOLN
10.0000 mL | Freq: Once | INTRAMUSCULAR | Status: AC
  Administered 2022-07-28: 10 mL via INTRADERMAL

## 2022-07-28 NOTE — H&P (Signed)
Chief Complaint: Patient was seen in consultation today for elevated LFTs, at the request of Outlaw,William  Referring Physician(s): Willis Modena  Supervising Physician: Oley Balm  Patient Status: [facility] - Out-pt  History of Present Illness: Todd Gallegos is a 67 y.o. male with PMHs of HTN, DM, GAD, and elevated LFTs who presents for random liver bx.   Patient is being followed by Dr. Dulce Sellar, he was referred to IR for image guided random liver bx due to elevated LFTs. No labs or imaging available in EMR.   Patient presents to [facility] IR today for the biopsy.  Patient laying in bed, not in acute distress.  Denise headache, fever, chills, shortness of breath, cough, chest pain, abdominal pain, nausea ,vomiting, and bleeding.  Patient states that he thinks his liver enzymes has been elevated for at least 1 year, it showed on his routine labs during physical exam.   Past Medical History:  Diagnosis Date   Acid reflux    Diabetes (HCC)    Per PCP records, never had abnl HgA1c - highest fasting glucose 115 - seems more metabolic syndrome   GAD (generalized anxiety disorder)    Hepatic steatosis    History of alcohol use    heavy   Hypercholesteremia    Hypertension    Neuropathy    Perthes disease    childhood    Past Surgical History:  Procedure Laterality Date   CARDIAC CATHETERIZATION     COLONOSCOPY     x 2    Allergies: Patient has no known allergies.  Medications: Prior to Admission medications   Medication Sig Start Date End Date Taking? Authorizing Provider  acetaminophen (TYLENOL) 500 MG tablet 1 tablet as needed Orally every 6 hrs    [provider]  atorvastatin (LIPITOR) 20 MG tablet Take 20 mg by mouth daily.    [provider]  benazepril-hydrochlorthiazide (LOTENSIN HCT) 20-25 MG tablet Take 1 tablet by mouth daily.    [provider]  buPROPion (WELLBUTRIN XL) 300 MG 24 hr tablet Take 300 mg by mouth daily.    [provider]  cephALEXin (KEFLEX) 500 MG capsule 1 capsule Orally Four times a day for 7 days 02/24/22   [provider]  Coenzyme Q10 (CO Q 10 PO) Take 60 mg by mouth daily.    [provider]  diclofenac Sodium (VOLTAREN) 1 % GEL Apply 2 g topically 4 (four) times daily as needed. 05/14/21   Levert Feinstein, MD  DULoxetine (CYMBALTA) 60 MG capsule Take 1 capsule (60 mg total) by mouth daily. 01/04/22   Levert Feinstein, MD  escitalopram (LEXAPRO) 20 MG tablet Take 20 mg by mouth daily.    [provider]  ferrous sulfate 325 (65 FE) MG tablet Take 1 tablet by mouth See admin instructions.    [provider]  gabapentin (NEURONTIN) 300 MG capsule Take 3 capsules (900 mg total) by mouth 3 (three) times daily. 05/14/21   Levert Feinstein, MD  Glucosamine-Chondroit-Vit C-Mn (GLUCOSAMINE 1500 COMPLEX PO) Take 1 tablet by mouth daily.    [provider]  ibuprofen (ADVIL) 200 MG tablet Take 200 mg by mouth 2 (two) times daily. Three tablets, twice daily    [provider]  lidocaine-prilocaine (EMLA) cream Apply 1 application topically as needed. 1 gram qid prn 12/22/20   Levert Feinstein, MD  methocarbamol (ROBAXIN) 750 MG tablet Take 750 mg by mouth every 6 (six) hours as needed. 05/01/21   [provider]  mupirocin  ointment (BACTROBAN) 2 % Apply 1 Application topically 2 (two) times daily. 02/25/22   McDonald, Adam R, DPM  NITRO-BID 2 % ointment APPLY TO THE AFFECTED AREA EVERY 12 HOURS 03/15/22   McDonald, Rachelle Hora, DPM  Nutritional Supplements (CREATINE) 750 MG CAPS Take 1 tablet by mouth See admin instructions.    [provider]  omeprazole (PRILOSEC) 20 MG capsule Take 20 mg by mouth daily.    [provider]  oxyCODONE-acetaminophen (PERCOCET/ROXICET) 5-325 MG tablet Take 1 tablet by mouth 3 (three) times daily as needed. 11/20/21   [provider]  testosterone cypionate (DEPOTESTOSTERONE CYPIONATE) 200 MG/ML injection Inject 200 mg  into the muscle every 7 (seven) days.    [provider]     Family History  Problem Relation Age of Onset   Asthma Mother    Hypercholesterolemia Mother    Hypertension Mother    Colon cancer Father    Hypercholesterolemia Father    Hypertension Father    CAD Father     Social History   Socioeconomic History   Marital status: Married    Spouse name: Not on file   Number of children: 2   Years of education: some college   Highest education level: Not on file  Occupational History   Occupation: Emergency planning/management officer  Tobacco Use   Smoking status: Former   Smokeless tobacco: Former  Substance and Sexual Activity   Alcohol use: Not Currently    Comment: hx of heavy use prior to 2016   Drug use: Never   Sexual activity: Not on file  Other Topics Concern   Not on file  Social History Narrative   Caffeine: 1 pot coffee per day   Lives with wife   Right handed   Social Determinants of Health   Financial Resource Strain: Not on file  Food Insecurity: Not on file  Transportation Needs: Not on file  Physical Activity: Not on file  Stress: Not on file  Social Connections: Not on file     Review of Systems: A 12 point ROS discussed and pertinent positives are indicated in the HPI above.  All other systems are negative.  Vital Signs: There were no vitals taken for this visit.   Physical Exam Vitals and nursing note reviewed.  Constitutional:      General: Patient is not in acute distress.    Appearance: Normal appearance. Patient is not ill-appearing.  HENT:     Head: Normocephalic and atraumatic.     Mouth/Throat:     Mouth: Mucous membranes are moist.     Pharynx: Oropharynx is clear.  Cardiovascular:     Rate and Rhythm: Normal rate and regular rhythm.     Pulses: Normal pulses.     Heart sounds: Normal heart sounds.  Pulmonary:     Effort: Pulmonary effort is normal.     Breath sounds: Normal breath sounds.  Abdominal:     General: Abdomen is flat.  Bowel sounds are normal.     Palpations: Abdomen is soft.  Musculoskeletal:     Cervical back: Neck supple.  Skin:    General: Skin is warm and dry.     Coloration: Skin is not jaundiced or pale.  Neurological:     Mental Status: Patient is alert and oriented to person, place, and time.  Psychiatric:        Mood and Affect: Mood normal.        Behavior: Behavior normal.  Judgment: Judgment normal.    MD Evaluation Airway: WNL Heart: WNL Abdomen: WNL Chest/ Lungs: WNL ASA  Classification: 2 Mallampati/Airway Score: One  Imaging: No results found.  Labs:  CBC: Recent Labs    02/26/22 1514 07/28/22 1203  WBC 6.4 6.6  HGB 16.4 15.9  HCT 48.8 44.9  PLT 365 374    COAGS: No results for input(s): "INR", "APTT" in the last 8760 hours.  BMP: No results for input(s): "NA", "K", "CL", "CO2", "GLUCOSE", "BUN", "CALCIUM", "CREATININE", "GFRNONAA", "GFRAA" in the last 8760 hours.  Invalid input(s): "CMP"  LIVER FUNCTION TESTS: No results for input(s): "BILITOT", "AST", "ALT", "ALKPHOS", "PROT", "ALBUMIN" in the last 8760 hours.  TUMOR MARKERS: No results for input(s): "AFPTM", "CEA", "CA199", "CHROMGRNA" in the last 8760 hours.  Assessment and Plan: 67 y.o. male with elevated LFTs who presents for random liver bx.   NPO since Mn No VS today yet  CBC stable  INR in process at 1236 hrs  Not on AC/AP   Risks and benefits of random liver bx was discussed with the patient and/or patient's family including, but not limited to bleeding, infection, damage to adjacent structures or low yield requiring additional tests.  All of the questions were answered and there is agreement to proceed.  Consent signed and in chart.    Thank you for this interesting consult.  I greatly enjoyed meeting Todd Gallegos and look forward to participating in their care.  A copy of this report was sent to the requesting provider on this date.  Electronically Signed: Willette Brace,  PA-C 07/28/2022, 12:36 PM   I spent a total of  30 Minutes   in face to face in clinical consultation, greater than 50% of which was counseling/coordinating care for random liver bx.   This chart was dictated using voice recognition software.  Despite best efforts to proofread,  errors can occur which can change the documentation meaning.

## 2022-07-28 NOTE — Procedures (Signed)
  Procedure:  [place] core liver biopsy   Preprocedure diagnosis: Diagnoses of Elevated liver enzymes and Soft tissue mass were pertinent to this visit. Postprocedure diagnosis: same EBL:    minimal Complications:   none immediate  See full dictation in [employer].  Thora Lance MD Main # [phone] Pager  [phone] Mobile [phone]

## 2022-07-29 LAB — SURGICAL PATHOLOGY

## 2022-10-09 ENCOUNTER — Other Ambulatory Visit: Payer: Self-pay | Admitting: Neurology

## 2022-11-24 ENCOUNTER — Other Ambulatory Visit: Payer: Self-pay | Admitting: Neurology

## 2022-12-22 ENCOUNTER — Telehealth: Payer: Self-pay | Admitting: Neurology

## 2022-12-22 MED ORDER — GABAPENTIN 300 MG PO CAPS: 900.0000 mg | ORAL_CAPSULE | Freq: Three times a day (TID) | ORAL | 3 refills | Status: AC

## 2022-12-22 MED ORDER — DULOXETINE HCL 60 MG PO CPEP: 60.0000 mg | ORAL_CAPSULE | Freq: Every day | ORAL | 3 refills | Status: DC

## 2022-12-22 NOTE — Telephone Encounter (Signed)
Pt request refill for gabapentin (NEURONTIN) 300 MG capsule and DULoxetine (CYMBALTA) 60 MG capsule send to  [facility]   Pt has scheduled an appt  with Shanda Bumps, NP on 12/23/22 at 1:45 pm

## 2022-12-22 NOTE — Telephone Encounter (Signed)
Refill sent as requested. 

## 2022-12-23 ENCOUNTER — Encounter: Payer: Self-pay | Admitting: Adult Health

## 2022-12-23 ENCOUNTER — Ambulatory Visit (INDEPENDENT_AMBULATORY_CARE_PROVIDER_SITE_OTHER): Payer: Medicare Other | Admitting: Adult Health

## 2022-12-23 VITALS — BP 154/86 | HR 83 | Ht 69.0 in | Wt 193.0 lb

## 2022-12-23 DIAGNOSIS — M48062 Spinal stenosis, lumbar region with neurogenic claudication: Secondary | ICD-10-CM | POA: Diagnosis not present

## 2022-12-23 DIAGNOSIS — M792 Neuralgia and neuritis, unspecified: Secondary | ICD-10-CM

## 2022-12-23 NOTE — Progress Notes (Addendum)
ASSESSMENT AND PLAN  Todd Gallegos is a 67 y.o. male   Peripheral neuropathy  Continue gabapentin  900/1200 - refill up to date  Continue duloxetine 60 mg daily - refill up to date  Hesitant to increase dose of gabapentin or add additional medication due to further fatigue side effect  Advised to discuss other treatment options with [facility] for continued pain such as possibly Qutenza  Will reach out to see if [facility] can take over treatment/management   S/p SCS 11/2021 - notes some benefit EMG nerve conduction study confirmed mild length dependent axonal sensorimotor polyneuropathy, mainly small fiber involvement  Potential etiology including his previous 35 years of heavy alcohol use, A1c was 6.2, he is scheduled with PCP in Jan/Feb and plans on repeat lab work   Lumbar stenosis with neurogenic claudication  Followed by [facility]  Recently underwent bilateral epidural steroid injection 11/2022 and bilateral L3-4 nerve root block/transforaminal epidural injection 10/2022  MRI of lumbar showed severe L3-4 stenosis    Request ongoing follow up with [facility] specialist to consolidate care and ongoing monitoring and management of both peripheral neuropathy and lumbar stenosis    DIAGNOSTIC DATA (LABS, IMAGING, TESTING) - I reviewed patient records, labs, notes, testing and imaging myself where available.   HISTORICAL   Update 12/23/2022 JM: Patient returns for follow-up visit after prior visit with Dr. Terrace Arabia over 1.5 years ago.  Reports his symptoms have been the same since prior visit.  He continues to experience lower extremity neuropathy symptoms, left foot greater than right.  Symptoms can fluctuate.  In the evening time, he can have a searing pain sensation in his left foot which will gradually resolve on its own.  Symptoms are typically worse in the evening time, gabapentin helps take the edge off as well as Voltaren gel.   He also notes some benefit with spinal cord stimulator.  Currently taking gabapentin 300mg  3 caps at 5pm and 4 caps at 8pm. He is unable to tolerate daytime dosage. He is also on duloxetine 60 mg daily.  Continues to have low back and leg pain which worsens after prolonged walking, resolves after sitting or bending forward.  Denies any weakness, denies any recent falls.  Has been being followed by [facility],  11/2021 s/p SCS 03/2022 s/p b/l L4-S1 RFA  10/2022 bilateral L3-4 nerve root block/transforaminal epidural injection 11/2022 s/p epidural steroid injection     History provided for reference purposes only UPDATE May 14 2021 Dr. Terrace Arabia: He underwent C3-7 posterior fusion by neurosurgeon Dr. Petra Kuba on March 31, 2021, at [facility] prior to surgery, he has worsening neck pain, arm paresthesia, weakness prior to cervical decompression surgery, he is recovering well  But he continue has mild gait abnormality, denies bowel and bladder incontinence.  He has known L3-4 severe stenosis, under reasonable control after radiofrequency ablation procedure, and higher dose of gabapentin  The most bothersome symptoms is evening time bilateral feet, especially the arch area burning needle pricking pain, despite higher dose of gabapentin 300 mg 3 tablets 3 times a day, diclofenac gel as needed was helpful,   Update December 22, 2020 Dr. Terrace Arabia: Personally reviewed MRI lumbar in April 2021, multilevel degenerative changes, most obvious at L3-4, there is subligamentous disc herniation, prominent facet hypertrophy, with moderately severe canal stenosis, bilateral foraminal narrowing,  EMG nerve conduction study in March also confirmed mild bilateral axonal sensorimotor polyneuropathy,  Laboratory evaluation showed normal ESR  C-reactive protein ANA, RPR, protein electrophoresis, prediabetes, with mild elevated A1c 6-6.2,  His low back pain has improved with nerve ablation, he has  stayed on higher dose of gabapentin 300 mg up to 3 tablets 3 times a day, which has helped his evening time foot paresthesia, he can sleep much better, he also use diclofenac gel as needed which is also helpful  He denies significant gait abnormality, denies bowel bladder incontinence,  Addendum, I reviewed [facility] evaluation from Dr. Petra Kuba on Jun 11, 2019, lumbar stenosis with neurogenic claudication, severe central canal stenosis at L3-4 level, secondary to a combination of chronic degenerative disc, facet arthropathy changes, Dr. Petra Kuba advised aggressive medical management including medication treatment, physical therapy, escalation to epidural steroid injection, if the radicular/neurogenic claudication system persisted despite aggressive medical management, then surgical intervention such as decompressive laminectomy with medial facetectomy at L3-4 levels could be considered   UPDATE April 04 2019 Dr. Terrace Arabia: Patient return for electrodiagnostic study today, which showed evidence of mild axonal sensorimotor polyneuropathy, there is no evidence of bilateral lumbosacral radiculopathy.  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, wear a hip brace for few years  Laboratory evaluation showed normal negative ANA, ESR, C-reactive protein, protein electrophoresis,  Ultrasound of bilateral lower extremities soft tissue examination showed no clinical concerns bilaterally   Consult visit 02/23/2019 Dr. Terrace Arabia: Todd Gallegos is a 67 year old male, seen in request by his primary care physician Dr. Barbaraann Barthel, [place] R, for  evaluation of bilateral feet paresthesia, initial evaluation was on February 23, 2019.  I have reviewed and summarized the referring note from the referring physician.  He had a past medical history of depression, currently on stable dose of Lexapro 20 mg daily, Wellbutrin XL 300 mg daily, also had a history of hypertension, prediabetes, most recent A1c was 6.2, he reported 35 years history of heavy alcohol use, quitted in 2015  Around 2015, he noticed mild bilateral feet and toes paresthesia, mainly involving plantar surface, numbness tingling, gradually getting worse over the past 5 years, now at evening time, when he tried to relax, sitting down watching TV, he would have burning pain of his bilateral feet, difficult to sit still, sometimes toes purplish reddish discoloration,  He denies significant gait abnormality, able to run 2 to 3 miles without difficulty, but with running, he noticed burning sensation extending to lateral Gallegos level, over the past 10 years, he also noticed bilateral Gallegos lateral mass, soft, nontender to touch, stable in size, but was running, it becomes sensitive, burning pain.  He denies bilateral upper extremity involvement, he denies neck pain,  Laboratory evaluations in December 2020 showed elevated A1c 6.2, normal B12, folic acid, TSH, CMP showed mild abnormal liver functional test, normal CBC hemoglobin of 14         PHYSICAL EXAM Today's Vitals   12/23/22 1323  BP: (!) 154/86  Pulse: 83  Weight: 193 lb (87.5 kg)  Height: 5\' 9"  (1.753 m)   Body mass index is 28.5 kg/m.   PHYSICAL EXAMNIATION:  NEUROLOGICAL EXAM:  MENTAL STATUS: Speech/cognition Awake, alert, oriented to history taking care of conversation   CRANIAL NERVES: CN  II: Visual fields are full to confrontation. Pupils are round equal and briskly reactive to light. CN III, IV, VI: extraocular movement are normal. No ptosis. CN V: Facial sensation is intact to light touch CN VII: Face  is symmetric with normal eye closure  CN VIII: Hearing is normal to causal conversation. CN IX, X: Phonation is normal. CN XI: Head turning and shoulder shrug are intact  MOTOR: There is no pronator drift of out-stretched arms. Muscle bulk and tone are normal. Muscle strength is normal.  REFLEXES: Reflexes are 3 and symmetric at the biceps, triceps, 3/3 knees, and trace at ankles. Plantar responses are flexor.  SENSORY: Length dependent decreased to ]pinprick and vibratory sensation to ankle level, L>R   COORDINATION: No limb dysmetria noted  GAIT/STANCE: Mildly unsteady,  REVIEW OF SYSTEMS: Full 14 system review of systems performed and notable only for as above All other review of systems were negative.  ALLERGIES: No Known Allergies  HOME MEDICATIONS: Current Outpatient Medications  Medication Sig Dispense Refill   acetaminophen (TYLENOL) 500 MG tablet 1 tablet as needed Orally every 6 hrs     atorvastatin (LIPITOR) 20 MG tablet Take 40 mg by mouth daily.     benazepril-hydrochlorthiazide (LOTENSIN HCT) 20-25 MG tablet Take 1 tablet by mouth daily.     buPROPion (WELLBUTRIN XL) 300 MG 24 hr tablet Take 300 mg by mouth daily.     diclofenac Sodium (VOLTAREN) 1 % GEL Apply 2 g topically 4 (four) times daily as needed. 700 g 4   DULoxetine (CYMBALTA) 60 MG capsule Take 1 capsule (60 mg total) by mouth daily. 90 capsule 3   escitalopram (LEXAPRO) 20 MG tablet Take 20 mg by mouth daily.     gabapentin (NEURONTIN) 300 MG capsule Take 3 capsules (900 mg total) by mouth 3 (three) times daily. 810 capsule 3   ibuprofen (ADVIL) 200 MG tablet Take 200 mg by mouth 2 (two) times daily. Three tablets, twice daily     omeprazole (PRILOSEC) 20 MG capsule Take 20 mg by mouth daily.     testosterone cypionate (DEPOTESTOSTERONE CYPIONATE) 200 MG/ML injection Inject 200 mg into the muscle every 7 (seven) days.     cephALEXin (KEFLEX) 500 MG capsule 1 capsule Orally Four times a day for 7 days      Coenzyme Q10 (CO Q 10 PO) Take 60 mg by mouth daily.     ferrous sulfate 325 (65 FE) MG tablet Take 1 tablet by mouth See admin instructions.     Glucosamine-Chondroit-Vit C-Mn (GLUCOSAMINE 1500 COMPLEX PO) Take 1 tablet by mouth daily.     lidocaine-prilocaine (EMLA) cream Apply 1 application topically as needed. 1 gram qid prn 120 g 3   methocarbamol (ROBAXIN) 750 MG tablet Take 750 mg by mouth every 6 (six) hours as needed.     mupirocin ointment (BACTROBAN) 2 % Apply 1 Application topically 2 (two) times daily. 30 g 2   NITRO-BID 2 % ointment APPLY TO THE AFFECTED AREA EVERY 12 HOURS 30 g 0   Nutritional Supplements (CREATINE) 750 MG CAPS Take 1 tablet by mouth See admin instructions.     oxyCODONE-acetaminophen (PERCOCET/ROXICET) 5-325 MG tablet Take 1 tablet by mouth 3 (three) times daily as needed.     No current facility-administered medications for this visit.    PAST MEDICAL HISTORY: Past Medical History:  Diagnosis Date   Acid reflux    Diabetes (HCC)    Per PCP records, never had abnl HgA1c - highest  fasting glucose 115 - seems more metabolic syndrome   GAD (generalized anxiety disorder)    Hepatic steatosis    History of alcohol use    heavy   Hypercholesteremia    Hypertension    Neuropathy    Perthes disease    childhood   Raynaud's syndrome     PAST SURGICAL HISTORY: Past Surgical History:  Procedure Laterality Date   CARDIAC CATHETERIZATION     COLONOSCOPY     x 2   SPINAL CORD STIMULATOR IMPLANT     02/2021    FAMILY HISTORY: Family History  Problem Relation Age of Onset   Asthma Mother    Hypercholesterolemia Mother    Hypertension Mother    Colon cancer Father    Hypercholesterolemia Father    Hypertension Father    CAD Father    Neuropathy Neg Hx     SOCIAL HISTORY: Social History   Socioeconomic History   Marital status: Married    Spouse name: Not on file   Number of children: 2   Years of education: some college   Highest  education level: Not on file  Occupational History   Occupation: Emergency planning/management officer  Tobacco Use   Smoking status: Former   Smokeless tobacco: Former  Substance and Sexual Activity   Alcohol use: Not Currently    Comment: hx of heavy use prior to 2016   Drug use: Never   Sexual activity: Not on file  Other Topics Concern   Not on file  Social History Narrative   Caffeine: 1 pot coffee per day   Lives with wife   Right handed   Retired    International aid/development worker of Health   Financial Resource Strain: Patient Declined (02/20/2022)   [facility] [employer], [facility]   Overall Financial Resource Strain (CARDIA)    Difficulty of Paying Living Expenses: Patient declined  Food Insecurity: Unknown (02/20/2022)   Received from [facility], [facility]   Hunger Vital Sign    Worried About Running Out of Food in the Last Year: Patient refused    Ran Out of Food in the Last Year: Patient refused  Transportation Needs: Patient Declined (02/20/2022)   [facility], [facility]   [facility] - Transportation    Lack of Transportation (Medical): Patient declined    Lack of Transportation (Non-Medical): Patient declined  Physical Activity: Sufficiently Active (02/20/2022)   [facility], [facility]   Exercise Vital Sign    Days of Exercise per Week: 6 days    Minutes of Exercise per Session: 90 min  Stress: No Stress Concern Present (02/20/2022)   Received from [facility], [facility], [facility], [facility]   [employer] [facility] - Occupational Stress Questionnaire    Feeling of Stress : Only a little  Social Connections: Moderately Integrated (02/20/2022)   [facility], [facility]   Social Network    How would you rate your social network (family, work, friends)?: Adequate participation with  social networks  Intimate Partner Violence: Not At Risk (02/20/2022)   Received from [facility], [facility]   HITS    Over the last 12 months how often did your partner physically hurt you?: 1    Over the last 12 months how often did your partner insult you or talk down to you?: 1    Over the  last 12 months how often did your partner threaten you with physical harm?: 1    Over the last 12 months how often did your partner scream or curse at you?: 1      I spent 30 minutes of face-to-face and non-face-to-face time with patient.  This included previsit chart review, lab review, study review, order entry, electronic health record documentation, patient education and discussion regarding above diagnoses and treatment plan and answered all other questions to patient's satisfaction  Ihor Austin, [facility]  [facility] [address] [place], [place] [address]  Phone [phone] Fax [phone] Note: This document was prepared with digital dictation and possible smart phrase technology. Any transcriptional errors that result from this process are unintentional.

## 2022-12-23 NOTE — Patient Instructions (Addendum)
Your Plan:  Continue current treatment regimen  Can discuss Qutenza treatment with [facility] - unsure if still only approved for diabetic neuropathy  Will discuss [facility] taking over treatment of your neuropathy   Will discuss any other treatment options with Dr. Terrace Arabia        Thank you for coming to see [place] at [facility]. I hope we have been able to provide you high quality care today.  You may receive a patient satisfaction survey over the next few weeks. We would appreciate your feedback and comments so that we may continue to improve ourselves and the health of our patients.

## 2022-12-27 NOTE — Progress Notes (Signed)
Chart reviewed, agree above plan ?

## 2023-11-21 ENCOUNTER — Other Ambulatory Visit: Payer: Self-pay | Admitting: Neurology

## 2023-11-21 NOTE — Telephone Encounter (Signed)
Last seen on 02/11/22 No follow up scheduled
